# Patient Record
Sex: Female | Born: 1950 | Race: White | Hispanic: No | Marital: Married
Health system: Midwestern US, Academic
[De-identification: ages and names within clinical notes are randomized; demographics above are authoritative.]

---

## 2016-08-15 MED ORDER — DULOXETINE 60 MG PO CPDR
60 mg | ORAL_CAPSULE | Freq: Every day | ORAL | 8 refills | 60.00000 days | Status: DC
Start: 2016-08-15 — End: 2016-08-16

## 2016-08-15 MED ORDER — PROGESTERONE MICRONIZED 100 MG PO CAP
100 mg | ORAL_CAPSULE | Freq: Every day | ORAL | 8 refills | Status: DC
Start: 2016-08-15 — End: 2016-08-16

## 2016-08-16 MED ORDER — PROGESTERONE MICRONIZED 100 MG PO CAP
100 mg | ORAL_CAPSULE | Freq: Every day | ORAL | 1 refills | Status: DC
Start: 2016-08-16 — End: 2017-03-14

## 2016-08-16 MED ORDER — DULOXETINE 60 MG PO CPDR
60 mg | ORAL_CAPSULE | Freq: Every day | ORAL | 8 refills | 60.00000 days | Status: DC
Start: 2016-08-16 — End: 2016-08-16

## 2016-08-16 MED ORDER — DULOXETINE 60 MG PO CPDR
60 mg | ORAL_CAPSULE | Freq: Every day | ORAL | 1 refills | 60.00000 days | Status: DC
Start: 2016-08-16 — End: 2017-05-15

## 2016-08-21 MED ORDER — IOPAMIDOL 41 % IT SOLN
2.5 mL | Freq: Once | EPIDURAL | 0 refills | Status: CP
Start: 2016-08-21 — End: ?

## 2016-08-21 MED ORDER — TRIAMCINOLONE ACETONIDE 40 MG/ML IJ SUSP
80 mg | Freq: Once | EPIDURAL | 0 refills | Status: CP
Start: 2016-08-21 — End: ?

## 2016-08-21 MED ORDER — BUPIVACAINE (PF) 0.5 % (5 MG/ML) IJ SOLN
4 mL | Freq: Once | INTRAMUSCULAR | 0 refills | Status: CP
Start: 2016-08-21 — End: ?

## 2016-09-12 MED ORDER — GABAPENTIN 100 MG PO CAP
ORAL_CAPSULE | Freq: Every evening | 3 refills | Status: DC
Start: 2016-09-12 — End: 2016-10-17

## 2016-09-12 MED ORDER — LISINOPRIL 10 MG PO TAB
10 mg | ORAL_TABLET | Freq: Every day | ORAL | 3 refills | Status: DC
Start: 2016-09-12 — End: 2017-02-12

## 2016-09-12 MED ORDER — ALPRAZOLAM 0.25 MG PO TAB
ORAL_TABLET | Freq: Three times a day (TID) | 0 refills | Status: DC | PRN
Start: 2016-09-12 — End: 2017-02-08

## 2016-10-17 ENCOUNTER — Encounter: Admit: 2016-10-17 | Discharge: 2016-10-17 | Payer: MEDICARE

## 2016-10-17 MED ORDER — GABAPENTIN 300 MG PO CAP
300 mg | ORAL_CAPSULE | Freq: Every evening | ORAL | 3 refills | Status: AC
Start: 2016-10-17 — End: 2016-12-19

## 2016-10-17 NOTE — Telephone Encounter
Patient prescribed   gabapentin (NEURONTIN) 100 mg capsule 90 capsule 3 09/12/2016     Sig: Take 1 at night for 3 days, then 2 nightly for 3 days then 3 nightly      She is requesting a new Rx as she is currently taking 3 of the 100 mg capsules per night. New Rx sent to [facility] for 300 mg capsule.

## 2016-10-31 ENCOUNTER — Encounter: Admit: 2016-10-31 | Discharge: 2016-10-31 | Payer: MEDICARE

## 2016-10-31 ENCOUNTER — Ambulatory Visit: Admit: 2016-10-31 | Discharge: 2016-11-01 | Payer: Commercial Managed Care - PPO

## 2016-10-31 DIAGNOSIS — F419 Anxiety disorder, unspecified: Principal | ICD-10-CM

## 2016-10-31 DIAGNOSIS — K529 Noninfective gastroenteritis and colitis, unspecified: Principal | ICD-10-CM

## 2016-10-31 DIAGNOSIS — Z791 Long term (current) use of non-steroidal anti-inflammatories (NSAID): ICD-10-CM

## 2016-10-31 DIAGNOSIS — M545 Low back pain: ICD-10-CM

## 2016-10-31 DIAGNOSIS — I10 Essential (primary) hypertension: ICD-10-CM

## 2016-10-31 MED ORDER — SODIUM,POTASSIUM,MAG SULFATES 17.5-3.13-1.6 GRAM PO SOLR
354 mL | ORAL | 0 refills | Status: SS
Start: 2016-10-31 — End: 2016-12-27

## 2016-10-31 MED ORDER — SODIUM CHLORIDE 0.9 % IV SOLP
INTRAVENOUS | 0 refills | Status: CN
Start: 2016-10-31 — End: ?

## 2016-10-31 NOTE — Progress Notes
Date of Service: 10/31/2016    History of Present Illness    Kimberly Phelps is a 66 y.o. Caucasian female with PMH of anxiety, back pain and HTN, is referred by Larose Kells, MD for diarrhea.    she was in her usual state of health until about 6-7 months ago. She always has had normal bowel movement. Initially was one loose bowel movement in am. Now, she has multiple bathroom trips a day. Initial bowel movement in am in watery, BSS type 6-7, large volume. Later during theday, she will have more explosive watery diarrhea, smaller volume.     In jan 2018, she had fever, sick to her stomach, N/V and diarrhea, which lasted for 5 days. She felt so sick that she couldn't get out of the house and didn't seek medical care. Diarrhea continued this illness. She gets abd cramp just before having bowel movement, which resolves after bowel movement. No BRBPR, hematochezia, no melena. No FI.   Sometimes, she has to push even when it's the diarrhea. She feels incomplete evacuation (which is new to her).bowel movement doesn't correlate with food, or fasting.   In April, her husband had parenteral Abio therapy for severe arm infection. They have 2 cats and 2 dogs which are healthy. Weigh is stable in the last 6 mo. Appetite is OK. No skin rash. Doesn't take anti-diarrhea. No fiber supplements. Stool is easy to flush.     No travel, no sick contact.  She takes Mobic 15 mg every day for back pain at night for 3 yrs.   FH is significant for father having CRC at age 68, blood cancer (?) and prostate cancer.   Mother had breast cancer.        Past Medical History:   Diagnosis Date   ??? Anxiety    ??? Chronic lower back pain    ??? HTN (hypertension)      Past Surgical History:   Procedure Laterality Date   ??? BARTHOLIN GLAND CYST EXCISION  1978   ??? ELBOW SURGERY  1989    scar tissue removal for tendonitis     Social History   Substance Use Topics   ??? Smoking status: Former Smoker     Packs/day: 1.00     Years: 20.00 Types: Cigarettes     Quit date: 11/03/1995   ??? Smokeless tobacco: Never Used      Comment: none x 12 years   ??? Alcohol use 3.6 oz/week     6 Standard drinks or equivalent per week   used to smoke, quit in 1997. She drinks1-2 glasses at night, red wine. She is a Human resources officer. No children. Lives with husband.       Family History   Problem Relation Age of Onset   ??? Cancer-Breast Mother 30   ??? Hypertension Mother    ??? Other Mother      alcholics    ??? Cancer Father      colon, prostate, and Multiple myeloma   ??? Hypertension Father    ??? Other Father      alcohol   ??? Basal Cell Carcinoma Father    ??? Thyroid Disease Sister    ??? Migraines Sister    ??? Depression Sister    ??? Seizures Paternal Uncle    ??? Migraines Paternal Grandmother    ??? Melanoma Neg Hx      Allergies   Allergen Reactions   ??? Erythromycin NAUSEA AND VOMITING     ???  ALPRAZolam (XANAX) 0.25 mg tablet TAKE 1 TABLET BY MOUTH 3 TIMES A DAY AS NEEDED FOR ANXIETY   ??? duloxetine DR (CYMBALTA) 60 mg capsule Take 1 capsule by mouth daily.   ??? estradiol(+) (VIVELLE-DOT) 0.0375 mg/24 hr patch Apply 1 patch to top of skin as directed twice weekly.   ??? gabapentin (NEURONTIN) 300 mg capsule Take 1 capsule by mouth at bedtime daily.   ??? imiquimod(+) (ALDARA) 5 % topical cream Apply  topically to affected area three times weekly. Thin layer to top of nose 2-3 times per week x 4 months   ??? lidocaine (LIDODERM TP) Apply  topically to affected area.   ??? lisinopril (PRINIVIL; ZESTRIL) 10 mg tablet Take 1 tablet by mouth daily.   ??? meloxicam (MOBIC) 15 mg tablet TAKE 1 TABLET BY MOUTH EVERY DAY   ??? progesterone, micronized(+) (PROMETRIUM) 100 mg capsule Take 1 capsule by mouth daily.     Vitals:    10/31/16 1447   BP: (!) 138/92   Pulse: 89   Resp: 20   Temp: 36.6 ???C (97.8 ???F)   TempSrc: Oral   Weight: 70.3 kg (154 lb 14.4 oz)   Height: 160 cm (63)     Body mass index is 27.44 kg/m???.    Physical Exam: GENERAL:   Alert and oriented x 3, not in acute distress, looking stated age.  HEAD:  Normocephalic, atraumatic.  EYES: PERRLA, EOM intact, no conjunctivitis or jaundice.  ENT:  Oropharynx is clear, no thrush or oral ulcer, no nasal discharge.  NECK: Supple, no thyromegaly or bruit.  SKIN:  No rash, ulcer, erythema, spider nevi or jaundice.  LYMPH NODES:  No lymphadenopathy in cervical, axillary or inguinal.  LUNGS: Symmetric expansion, clear to auscultation bilaterally, no crackles or wheezing.  HEART:  Regular rate and rhythm, no murmur or gallop.  ABDOMEN:  Soft, non-tender, non-distended, no costovertebral angle tenderness, no rebound or guarding, no hepatosplenomegaly, no shifting dullness, bowel sounds are normoactive in all four quadrants.  RECTAL EXAM:  Not performed.  EXTREMITIES: No cyanosis, clubbing or edema.  NEURO:  Moves all 4 extremities without difficulty, gait is appropriate.  PSYCH:   Appropriate mood and behavior.    Lab/Radiology/Other Diagnostic Tests:    Orders Only on 10/05/2016   Component Date Value Ref Range Status   ??? White Blood Cells 10/05/2016 11.6* 3.8 - 10.8 Thousand/uL Final   ??? RBC 10/05/2016 4.88  3.80 - 5.10 Million/uL Final   ??? Hemoglobin 10/05/2016 14.8  11.7 - 15.5 g/dL Final   ??? Hematocrit 10/05/2016 44.1  35.0 - 45.0 % Final   ??? MCV 10/05/2016 90.4  80.0 - 100.0 fL Final   ??? MCH 10/05/2016 30.3  27.0 - 33.0 pg Final   ??? MCHC 10/05/2016 33.6  32.0 - 36.0 g/dL Final   ??? RDW 16/02/9603 12.6  11.0 - 15.0 % Final   ??? Platelet Count 10/05/2016 343  140 - 400 Thousand/uL Final   ??? MPV 10/05/2016 9.8  7.5 - 12.5 fL Final   ??? Absolute Neutrophil Count 10/05/2016 7784  1500 - 7800 cells/uL Final   ??? Absolute Lymph Count 10/05/2016 2888  850 - 3900 cells/uL Final   ??? Absolute Monocyte Count 10/05/2016 708  200 - 950 cells/uL Final   ??? Absolute Eosinophil Count 10/05/2016 139  15 - 500 cells/uL Final   ??? Absolute Basophil Count 10/05/2016 81  0 - 200 cells/uL Final ??? Neutrophils 10/05/2016 67.1  % Final   ??? Lymphocytes  10/05/2016 24.9  % Final   ??? Monocytes 10/05/2016 6.1  % Final   ??? Eosinophils 10/05/2016 1.2  % Final   ??? Basophils 10/05/2016 0.7  % Final    Comment: Test Performed at:  [facility]  [address]  [place]  [id number]  [place]         No results found for: [facility]    Comprehensive Metabolic Profile    Lab Results   Component Value Date/Time    NA 141 09/12/2016 03:24 PM    K 4.3 09/12/2016 03:24 PM    CL 104 09/12/2016 03:24 PM    CO2 24 09/12/2016 03:24 PM    GAP 7 (L) 10/04/2007 11:20 AM    BUN 19 09/12/2016 03:24 PM    CR 0.90 09/12/2016 03:24 PM    GLU 72 09/12/2016 03:24 PM    Lab Results   Component Value Date/Time    CA 9.6 09/12/2016 03:24 PM    ALBUMIN 4.4 03/26/2016 09:09 AM    TOTPROT 6.3 03/26/2016 09:09 AM    ALKPHOS 61 03/26/2016 09:09 AM    AST 17 03/26/2016 09:09 AM    ALT 12 03/26/2016 09:09 AM    TOTBILI 0.5 03/26/2016 09:09 AM    GFR 68 09/12/2016 03:24 PM    GFRAA 78 09/12/2016 03:24 PM             Review of Systems   Gastrointestinal: Positive for diarrhea and nausea.   Genitourinary: Positive for enuresis.   Musculoskeletal: Positive for back pain and myalgias.   Psychiatric/Behavioral: The patient is nervous/anxious.    All other systems reviewed and are negative.        Physical Exam         Assessment and Plan:      1. New onset chronic diarrhea  2. NSAID use         Kimberly Phelps is 66 y.o. WF with new onset chronic diarrhea, which started after an episode of febrile severe N/V/D. She complains of watery large volume diarrhea, and sense of incomplete evacuation. She drinks 2 ETOH drinks a day.  DD are post infectious IBS, vs. IBD, microscopic colitis, SIBO, pancreatic insufficiency, pelvic floor dysynergia, ...    Will check the severity and type of diarrhea. Infectious causes were tested. Stool leukocytes were negative.  Will check 24 hrs stool collection for fat and volume, stool elastase and stool electrolytes.  colonoscopy and EGD with Bx's.  CTE.  SIBO  Return to clinic after these tests.     Orders Placed This Encounter   ??? CT ENTEROGRAPHY   ??? FECAL FAT (TOTAL LIPIDS)   ??? ELECTROLYTES-FECAL   ??? PANCREATIC ELASTASE, STOOL            I explained the diagnosis and management plan in detail. No barrier to education was noted. she understood me well and repeated her understanding. I answered all her questions and concerns.   Jolee Ewing, MD

## 2016-11-01 ENCOUNTER — Encounter: Admit: 2016-11-01 | Discharge: 2016-11-01 | Payer: MEDICARE

## 2016-11-01 MED ORDER — ESTRADIOL 0.0375 MG/24 HR TD PTSW
MEDICATED_PATCH | 2 refills | 43.00000 days | Status: AC
Start: 2016-11-01 — End: 2017-12-18

## 2016-11-09 ENCOUNTER — Ambulatory Visit: Admit: 2016-11-09 | Discharge: 2016-11-09 | Payer: Commercial Managed Care - PPO

## 2016-11-09 DIAGNOSIS — Z791 Long term (current) use of non-steroidal anti-inflammatories (NSAID): Secondary | ICD-10-CM

## 2016-11-09 DIAGNOSIS — K529 Noninfective gastroenteritis and colitis, unspecified: Principal | ICD-10-CM

## 2016-11-09 LAB — POC CREATININE, RAD: Lab: 0.8 mg/dL (ref 0.4–1.00)

## 2016-11-09 MED ORDER — IOHEXOL 350 MG IODINE/ML IV SOLN
100 mL | Freq: Once | INTRAVENOUS | 0 refills | Status: CP
Start: 2016-11-09 — End: ?
  Administered 2016-11-09: 20:00:00 100 mL via INTRAVENOUS

## 2016-11-09 MED ORDER — SODIUM CHLORIDE 0.9 % IJ SOLN
50 mL | Freq: Once | INTRAVENOUS | 0 refills | Status: CP
Start: 2016-11-09 — End: ?
  Administered 2016-11-09: 20:00:00 50 mL via INTRAVENOUS

## 2016-11-26 ENCOUNTER — Encounter: Admit: 2016-11-26 | Discharge: 2016-11-26 | Payer: MEDICARE

## 2016-12-10 ENCOUNTER — Encounter: Admit: 2016-12-10 | Discharge: 2016-12-10 | Payer: MEDICARE

## 2016-12-10 NOTE — Telephone Encounter
Pre call completed to patient for their Hydrogen Breath Test scheduled on 12/12/16 at 0730. Diet instructions and restrictions reviewed at this time. Patient states understanding.

## 2016-12-12 ENCOUNTER — Encounter: Admit: 2016-12-12 | Discharge: 2016-12-12 | Payer: MEDICARE

## 2016-12-12 ENCOUNTER — Ambulatory Visit: Admit: 2016-12-12 | Discharge: 2016-12-12 | Payer: Commercial Managed Care - PPO

## 2016-12-12 DIAGNOSIS — I10 Essential (primary) hypertension: ICD-10-CM

## 2016-12-12 DIAGNOSIS — F419 Anxiety disorder, unspecified: Principal | ICD-10-CM

## 2016-12-12 DIAGNOSIS — Z791 Long term (current) use of non-steroidal anti-inflammatories (NSAID): Secondary | ICD-10-CM

## 2016-12-12 DIAGNOSIS — K529 Noninfective gastroenteritis and colitis, unspecified: Principal | ICD-10-CM

## 2016-12-12 DIAGNOSIS — M545 Low back pain: ICD-10-CM

## 2016-12-19 ENCOUNTER — Encounter: Admit: 2016-12-19 | Discharge: 2016-12-19 | Payer: MEDICARE

## 2016-12-19 ENCOUNTER — Ambulatory Visit: Admit: 2016-12-19 | Discharge: 2016-12-20 | Payer: Commercial Managed Care - PPO

## 2016-12-19 DIAGNOSIS — M47816 Spondylosis without myelopathy or radiculopathy, lumbar region: ICD-10-CM

## 2016-12-19 DIAGNOSIS — I10 Essential (primary) hypertension: ICD-10-CM

## 2016-12-19 DIAGNOSIS — M1710 Unilateral primary osteoarthritis, unspecified knee: ICD-10-CM

## 2016-12-19 DIAGNOSIS — F419 Anxiety disorder, unspecified: Principal | ICD-10-CM

## 2016-12-19 DIAGNOSIS — M545 Low back pain: ICD-10-CM

## 2016-12-19 MED ORDER — DICLOFENAC SODIUM 1 % TP GEL
4 g | Freq: Three times a day (TID) | TOPICAL | 3 refills | 19.00000 days | Status: AC
Start: 2016-12-19 — End: 2017-09-04

## 2016-12-20 ENCOUNTER — Encounter: Admit: 2016-12-20 | Discharge: 2016-12-20 | Payer: MEDICARE

## 2016-12-20 DIAGNOSIS — M5416 Radiculopathy, lumbar region: Principal | ICD-10-CM

## 2016-12-20 MED ORDER — MELOXICAM 15 MG PO TAB
ORAL_TABLET | Freq: Every day | 1 refills | Status: SS
Start: 2016-12-20 — End: 2016-12-27

## 2016-12-20 NOTE — Telephone Encounter
Contacted pt and informed her of Negative for SIBO on Hydrogen Breath test preformed on 12/12/16.    Pt had no further questions or concerns. Pt has colonoscopy scheduled 12/27/16.

## 2016-12-20 NOTE — Telephone Encounter
Patient seen in clinic by Dr. Johney MaineMitra-requesting assist with locating an aquatic therapy location, as insurance not covering location suggested in clinic. Emailed list of locations to patient. WIll call with any further needs.

## 2016-12-27 ENCOUNTER — Encounter: Admit: 2016-12-27 | Discharge: 2016-12-27 | Payer: MEDICARE

## 2016-12-27 ENCOUNTER — Ambulatory Visit: Admit: 2016-12-27 | Discharge: 2016-12-27 | Payer: Commercial Managed Care - PPO

## 2016-12-27 ENCOUNTER — Ambulatory Visit: Admit: 2016-12-27 | Discharge: 2016-12-27 | Payer: MEDICARE

## 2016-12-27 DIAGNOSIS — K5730 Diverticulosis of large intestine without perforation or abscess without bleeding: ICD-10-CM

## 2016-12-27 DIAGNOSIS — K259 Gastric ulcer, unspecified as acute or chronic, without hemorrhage or perforation: ICD-10-CM

## 2016-12-27 DIAGNOSIS — K210 Gastro-esophageal reflux disease with esophagitis: ICD-10-CM

## 2016-12-27 DIAGNOSIS — F419 Anxiety disorder, unspecified: Principal | ICD-10-CM

## 2016-12-27 DIAGNOSIS — Z87891 Personal history of nicotine dependence: ICD-10-CM

## 2016-12-27 DIAGNOSIS — K644 Residual hemorrhoidal skin tags: ICD-10-CM

## 2016-12-27 DIAGNOSIS — K449 Diaphragmatic hernia without obstruction or gangrene: ICD-10-CM

## 2016-12-27 DIAGNOSIS — M545 Low back pain: ICD-10-CM

## 2016-12-27 DIAGNOSIS — Z791 Long term (current) use of non-steroidal anti-inflammatories (NSAID): ICD-10-CM

## 2016-12-27 DIAGNOSIS — K319 Disease of stomach and duodenum, unspecified: ICD-10-CM

## 2016-12-27 DIAGNOSIS — R197 Diarrhea, unspecified: Principal | ICD-10-CM

## 2016-12-27 DIAGNOSIS — K648 Other hemorrhoids: ICD-10-CM

## 2016-12-27 DIAGNOSIS — I10 Essential (primary) hypertension: ICD-10-CM

## 2016-12-27 DIAGNOSIS — Z800 Family history of malignant neoplasm of digestive organs: ICD-10-CM

## 2016-12-27 DIAGNOSIS — M1710 Unilateral primary osteoarthritis, unspecified knee: ICD-10-CM

## 2016-12-27 DIAGNOSIS — M47816 Spondylosis without myelopathy or radiculopathy, lumbar region: ICD-10-CM

## 2016-12-27 MED ORDER — LACTATED RINGERS IV SOLP
500 mL | INTRAVENOUS | 0 refills | Status: DC
Start: 2016-12-27 — End: 2016-12-27

## 2016-12-27 MED ORDER — LIDOCAINE (PF) 200 MG/10 ML (2 %) IJ SYRG
0 refills | Status: DC
Start: 2016-12-27 — End: 2016-12-27
  Administered 2016-12-27: 18:00:00 40 mg via INTRAVENOUS

## 2016-12-27 MED ORDER — PROPOFOL INJ 10 MG/ML IV VIAL
0 refills | Status: DC
Start: 2016-12-27 — End: 2016-12-27
  Administered 2016-12-27: 18:00:00 20 mg via INTRAVENOUS
  Administered 2016-12-27: 18:00:00 80 mg via INTRAVENOUS
  Administered 2016-12-27: 18:00:00 20 mg via INTRAVENOUS
  Administered 2016-12-27: 18:00:00 50 mg via INTRAVENOUS
  Administered 2016-12-27: 18:00:00 30 mg via INTRAVENOUS
  Administered 2016-12-27: 18:00:00 60 mg via INTRAVENOUS
  Administered 2016-12-27: 18:00:00 20 mg via INTRAVENOUS

## 2016-12-27 MED ORDER — ONDANSETRON HCL (PF) 4 MG/2 ML IJ SOLN
4 mg | Freq: Once | INTRAVENOUS | 0 refills | Status: DC | PRN
Start: 2016-12-27 — End: 2016-12-27

## 2016-12-27 MED ORDER — OMEPRAZOLE 20 MG PO CPDR
20 mg | ORAL_CAPSULE | Freq: Every day | ORAL | 3 refills | Status: AC
Start: 2016-12-27 — End: 2017-04-02

## 2016-12-27 MED ORDER — PROPOFOL 10 MG/ML IV EMUL 20 ML (INFUSION)(AM)(OR)
INTRAVENOUS | 0 refills | Status: DC
Start: 2016-12-27 — End: 2016-12-27
  Administered 2016-12-27: 18:00:00 100 ug/kg/min via INTRAVENOUS

## 2016-12-27 MED ORDER — LIDOCAINE (PF) 10 MG/ML (1 %) IJ SOLN
.1-2 mL | Freq: Once | INTRAMUSCULAR | 0 refills | Status: CP
Start: 2016-12-27 — End: ?

## 2016-12-27 MED ADMIN — LIDOCAINE (PF) 10 MG/ML (1 %) IJ SOLN [95838]: 0.1 mL | INTRAMUSCULAR | @ 17:00:00 | Stop: 2016-12-27 | NDC 63323049227

## 2016-12-27 MED ADMIN — LACTATED RINGERS IV SOLP [4318]: 500 mL | INTRAVENOUS | @ 17:00:00 | Stop: 2016-12-27 | NDC 00409795303

## 2016-12-27 NOTE — H&P (View-Only)
Pre Procedure History and Physical/Sedation Plan    Date of Procedure:  12/27/2016    Planned Procedure(s):  EGD and colonoscopy   Sedation/Medication Plan: MAC (Monitored Anesthesia Care)  Discussion/Reviews:  Physician has discussed risks and alternatives of this type of sedation and above planned procedures with patient.  ___________________________________________________________________  Chief Complaint:  Diarrhea, father had CRC at age 65's. Her last colonoscopy was in 2013    History of Present Illness: Kimberly Phelps is a 66 y.o. female with   Past Medical History:   Diagnosis Date   ??? Anxiety    ??? Chronic lower back pain    ??? HTN (hypertension)    ??? Lumbar facet arthropathy (HCC)    ??? Patellofemoral arthritis        Past Surgical History:   Procedure Laterality Date   ??? BARTHOLIN GLAND CYST EXCISION  1978   ??? ELBOW SURGERY Right 1989    scar tissue removal for tendonitis       Allergies   Allergen Reactions   ??? Erythromycin NAUSEA AND VOMITING       Family History   Problem Relation Age of Onset   ??? Cancer-Breast Mother 59   ??? Hypertension Mother    ??? Other Mother         alcholics    ??? Cancer Father         colon, prostate, and Multiple myeloma   ??? Hypertension Father    ??? Other Father         alcohol   ??? Basal Cell Carcinoma Father    ??? Thyroid Disease Sister    ??? Migraines Sister    ??? Depression Sister    ??? Seizures Paternal Uncle    ??? Migraines Paternal Grandmother    ??? Melanoma Neg Hx        Previous Anesthetic/Sedation History:  No complications    Nursing Medical History     Nursing Surgical History     Social History   Substance Use Topics   ??? Smoking status: Former Smoker     Packs/day: 1.00     Years: 20.00     Types: Cigarettes     Quit date: 11/03/1995   ??? Smokeless tobacco: Never Used      Comment: none x 12 years   ??? Alcohol use 8.4 oz/week     6 Standard drinks or equivalent, 8 Glasses of wine per week     History   Drug Use No     Allergies:  Erythromycin  Medications Prescriptions Prior to Admission   Medication Sig   ??? ALPRAZolam (XANAX) 0.25 mg tablet TAKE 1 TABLET BY MOUTH 3 TIMES A DAY AS NEEDED FOR ANXIETY   ??? diclofenac(+) (VOLTAREN) 1 % topical gel Apply 4 g topically to affected area three times daily.   ??? duloxetine DR (CYMBALTA) 60 mg capsule Take 1 capsule by mouth daily.   ??? estradiol(+) (VIVELLE-DOT; ESCLIM) 0.0375 mg/24 hr patch PLACE 1 PATCH EVERY 7 DAYS.   ??? imiquimod(+) (ALDARA) 5 % topical cream Apply  topically to affected area three times weekly. Thin layer to top of nose 2-3 times per week x 4 months   ??? lidocaine (LIDODERM TP) Apply  topically to affected area.   ??? lisinopril (PRINIVIL; ZESTRIL) 10 mg tablet Take 1 tablet by mouth daily.   ??? progesterone, micronized(+) (PROMETRIUM) 100 mg capsule Take 1 capsule by mouth daily.     Review of Systems:  Comprehensive ROS  was reviewed with patient and is positive for as above.            Physical Exam:   Temp: 36.8 ???C (98.2 ???F) (08/16 1200)  Pulse: 74 (08/16 1200)  Respirations: 12 PER MINUTE (08/16 1200)  BP: 127/88 (08/16 1200)  GENERAL:   Alert and oriented x 3, not in acute distress, looking stated age, cooperative.  HEAD:  Normocephalic, atraumatic.  ENT:  Oropharynx is clear, no thrush or oral ulcer, no nasal discharge.  LUNGS: Symmetric expansion, clear to auscultation bilaterally, no crackles or wheezing.  HEART:  Regular rate and rhythm, no murmur or gallop.  ABDOMEN: Soft, non-tender, non-distended, no costovertebral angle tenderness, no rebound or guarding, no hepatosplenomegaly, no shifting dullness, bowel sounds are normoactive in all four quadrants.  EXTREMITIES:   No cyanosis, clubbing or edema.    Airway:  airway assessment performed  Mallampati I (soft palate, uvula, fauces, tonsillar pillars visible)  Anesthesia Classification:  ASA II (A normal patient with mild systemic disease)    Lab/Radiology/Other Diagnostic Tests  Labs:    Hematology:    Lab Results   Component Value Date HGB 14.8 10/05/2016    HCT 44.1 10/05/2016    PLTCT 343 10/05/2016    WBC 11.6 10/05/2016    NEUT 67.1 10/05/2016    ANC 7784 10/05/2016    ALC 2888 10/05/2016    RBC 4.88 10/05/2016    MONA 6.1 10/05/2016    AMC 708 10/05/2016    EOSA 1.2 10/05/2016    ABC 81 10/05/2016    MCV 90.4 10/05/2016    MCH 30.3 10/05/2016    MCHC 33.6 10/05/2016    MPV 9.8 10/05/2016    RDW 12.6 10/05/2016    and Coagulation:  No results found for: PT, PTT, INR    ATTESTATION  I personally performed the key portions of the E/M visit, discussed case with fellow and concur with fellow documentation of history, physical exam, assessment, and treatment plan unless otherwise noted.        Jolee Ewing, MD

## 2016-12-27 NOTE — Anesthesia Post-Procedure Evaluation
Post-Anesthesia Evaluation    Name: Kimberly Phelps      MRN: [id number]     DOB: April 18, 1951     Age: 66 y.o.     Sex: female   __________________________________________________________________________     Procedure Date: 12/27/2016  Procedure: Procedure(s):  COLONOSCOPY  ESOPHAGOGASTRODUODENOSCOPY w/ bxs      Surgeon: Moishe Spice):  Jolee Ewing, MD    Post-Anesthesia Vitals  BP: 148/86 (08/16 1354)  Temp: 36.7 C (98 F) (08/16 1354)  Pulse: 66 (08/16 1354)  Respirations: 15 PER MINUTE (08/16 1354)  SpO2: 99 % (08/16 1354)  O2 Delivery: None (Room Air) (08/16 1354)  SpO2 Pulse: 66 (08/16 1354)  Height: 160 cm (63") (08/16 1142)      Post Anesthesia Evaluation Note    Evaluation location: Pre/Post  Patient participation: recovered; patient participated in evaluation  Level of consciousness: alert    Pain score: 0  Pain management: adequate    Hydration: normovolemia  Temperature: 36.0C - 38.4C  Airway patency: adequate    Perioperative Events  Perioperative events:  no       Post-op nausea and vomiting: no PONV    Postoperative Status  Cardiovascular status: hemodynamically stable  Respiratory status: spontaneous ventilation  Follow-up needed: none        Perioperative Events  Perioperative Event: No  Emergency Case Activation: No

## 2016-12-31 ENCOUNTER — Encounter: Admit: 2016-12-31 | Discharge: 2016-12-31 | Payer: MEDICARE

## 2016-12-31 DIAGNOSIS — I10 Essential (primary) hypertension: ICD-10-CM

## 2016-12-31 DIAGNOSIS — M545 Low back pain: ICD-10-CM

## 2016-12-31 DIAGNOSIS — M1710 Unilateral primary osteoarthritis, unspecified knee: ICD-10-CM

## 2016-12-31 DIAGNOSIS — Z1239 Encounter for other screening for malignant neoplasm of breast: Principal | ICD-10-CM

## 2016-12-31 DIAGNOSIS — F419 Anxiety disorder, unspecified: Principal | ICD-10-CM

## 2016-12-31 DIAGNOSIS — M47816 Spondylosis without myelopathy or radiculopathy, lumbar region: ICD-10-CM

## 2017-01-24 ENCOUNTER — Encounter: Admit: 2017-01-24 | Discharge: 2017-01-24 | Payer: MEDICARE

## 2017-01-24 NOTE — Telephone Encounter
Received voicemail from Physical Therapist, Dagoberto Reef, regarding plan of care for patient needing signed, 3 attempt. Left message with fax number to fax of [phone].

## 2017-01-24 NOTE — Telephone Encounter
Received voicemail from patient regarding plan of care needing signed by Dr. Salley Scarlet. Called patient and left message that this nurse had been contacted by the physical therapist earlier. Informed patient the fax number was provided to the physical therapist and Dr. Salley Scarlet will be in Tuesday to sign.

## 2017-02-07 ENCOUNTER — Encounter: Admit: 2017-02-07 | Discharge: 2017-02-07 | Payer: MEDICARE

## 2017-02-07 NOTE — Telephone Encounter
lov 09-12-2016    Lf 09-12-2016

## 2017-02-08 MED ORDER — ALPRAZOLAM 0.25 MG PO TAB
ORAL_TABLET | Freq: Three times a day (TID) | 3 refills | Status: AC | PRN
Start: 2017-02-08 — End: 2018-08-27

## 2017-02-12 ENCOUNTER — Ambulatory Visit: Admit: 2017-02-12 | Discharge: 2017-02-13 | Payer: Commercial Managed Care - PPO

## 2017-02-12 ENCOUNTER — Encounter: Admit: 2017-02-12 | Discharge: 2017-02-12 | Payer: MEDICARE

## 2017-02-12 ENCOUNTER — Ambulatory Visit: Admit: 2017-02-12 | Discharge: 2017-02-12 | Payer: Commercial Managed Care - PPO

## 2017-02-12 DIAGNOSIS — Z23 Encounter for immunization: ICD-10-CM

## 2017-02-12 DIAGNOSIS — F419 Anxiety disorder, unspecified: ICD-10-CM

## 2017-02-12 DIAGNOSIS — M545 Low back pain: ICD-10-CM

## 2017-02-12 DIAGNOSIS — M47816 Spondylosis without myelopathy or radiculopathy, lumbar region: ICD-10-CM

## 2017-02-12 DIAGNOSIS — Z0000 Encounter for general adult medical examination without abnormal findings: Principal | ICD-10-CM

## 2017-02-12 DIAGNOSIS — K529 Noninfective gastroenteritis and colitis, unspecified: ICD-10-CM

## 2017-02-12 DIAGNOSIS — M1710 Unilateral primary osteoarthritis, unspecified knee: ICD-10-CM

## 2017-02-12 DIAGNOSIS — I10 Essential (primary) hypertension: Secondary | ICD-10-CM

## 2017-02-12 DIAGNOSIS — H9193 Unspecified hearing loss, bilateral: ICD-10-CM

## 2017-02-12 DIAGNOSIS — Z79890 Hormone replacement therapy (postmenopausal): ICD-10-CM

## 2017-02-12 LAB — LIPID PROFILE
Lab: 133 mg/dL — ABNORMAL HIGH (ref <100)
Lab: 139 mg/dL (ref 6.0–8.0)
Lab: 18 mg/dL (ref 8.5–10.6)
Lab: 212 mg/dL — ABNORMAL HIGH (ref <200)
Lab: 89 mg/dL (ref <150)

## 2017-02-12 LAB — COMPREHENSIVE METABOLIC PANEL
Lab: 0.4 mg/dL (ref 0.3–1.2)
Lab: 138 MMOL/L (ref 137–147)
Lab: 18 U/L (ref 7–40)
Lab: 27 MMOL/L (ref 21–30)
Lab: 3.8 MMOL/L (ref 3.5–5.1)
Lab: 4.2 g/dL (ref 3.5–5.0)
Lab: 58 U/L (ref 25–110)
Lab: 6 (ref 3–12)
Lab: >60 mL/min (ref >60)

## 2017-02-12 LAB — TSH WITH FREE T4 REFLEX: Lab: 3 uU/mL (ref 0.35–5.00)

## 2017-02-12 LAB — CBC
Lab: 10 10*3/uL (ref 4.5–11.0)
Lab: 4.7 M/UL (ref 4.0–5.0)

## 2017-02-12 MED ORDER — LISINOPRIL 20 MG PO TAB
20 mg | ORAL_TABLET | Freq: Every day | ORAL | 3 refills | Status: AC
Start: 2017-02-12 — End: 2017-12-16

## 2017-02-12 NOTE — Progress Notes
History of Present Illness  Kimberly Phelps is a 66 y.o. female.    Anxiety  0.25mg  alprazolam tid as needed  Occasional [place] only.   Depression screen neg.    HRT:  estradiol(+) (VIVELLE-DOT) 0.0375 mg/24 hr patch; Apply 1 patch to top of skin as directed twice weekly.  progesterone, micronized(+) (PROMETRIUM) 100 mg capsule  > start date: > 5 yearsm she is on 1/2 patch  > no history of hysterctomy    Hypertension Management:    Current regimen: Lisinopril 10mg   CVD Hx: none  Smoker: No  Outside blood pressures being performed: Yes  BP Readings from Last 3 Encounters:   02/12/17 137/87   12/27/16 148/86   12/19/16 139/89     The patient denies chest pain, shortness of breath, exertional shortness of breath, leg swelling.   Imp: Hypertension not controlled   Lab Results   Component Value Date/Time    MCALBR <0.2 01/17/2012 10:23 AM       Esophagitis and GERD:  Omeprazole 20mg  PO daily  EGD 12/27/2016 2 cm hiatal hernia, non bleeding erosions in the antrum, esophagitis. Neg H pylori.    Chronic Diarrhea - not active.  Jolee Ewing, MD  Panscreatic elastase, fecal electrolytes and fecal fat are pending.   Every since the colonoscopy and prep she has had no recurrence of the diarrhea. Total resolution.= in her symptoms.     Lumbar Radiculitis, Lumbar Facet Arthropathy   Salley Scarlet, MD, Voltaren Gel  > MRI planned if 6 weeks of NSAIDs with PT does not improve pain.  > She has started aqua therapy and some land therapy. Doing well.     Health Maintenance  Breast Cancer Screening (q46yr): 11/11/2015 birads 1  Cervical Cancer Screening: (q5y w/ HPV, q3r):  2016  Colorectal Cancer:12/27/2016 q5 years  Bone Desnit: repeat due in 2019  BMI: Estimated body mass index is 27.28 kg/m??? as calculated from the following:    Height as of this encounter: 160 cm (63).    Weight as of this encounter: 69.9 kg (154 lb).  Tobacco Screen (q2y):   History   Smoking Status   ??? Former Smoker   ??? Packs/day: 1.00   ??? Years: 20.00 ??? Types: Cigarettes   ??? Quit date: 11/03/1995   Smokeless Tobacco   ??? Never Used     Comment: none x 12 years      Fall Risk Assessment: low risk  Post Menopaussal?  yes    Depression Screening:  Patient Scores:  PHQ-2: PHQ-2 Score: 0 (03/16/2016  2:31 PM)  PHQ-9: No Data Recorded  Interventions:  PHQ-2: PHQ-2 Score less than 3: No follow-up or recommendations are necessary at this time (03/16/2016  2:31 PM)  Depression Interventions PHQ-2/9: No Data Recorded    Social  She used to live in [place]. She has 2 sisters in the area.  Has step kids.   She worked in Education officer, environmental.  Works at [employer], [place] [employer] [place].       Review of Systems   Constitutional: Negative for activity change, chills and unexpected weight change.   HENT: Negative for congestion and sore throat.    Eyes: Negative for visual disturbance.   Respiratory: Negative for cough, chest tightness, shortness of breath and wheezing.    Cardiovascular: Negative for chest pain and leg swelling.   Gastrointestinal: Negative for abdominal pain and blood in stool.   Endocrine: Negative for cold intolerance, heat intolerance, polydipsia, polyphagia and polyuria.   Genitourinary:  Negative for difficulty urinating and urgency.   Musculoskeletal: Negative for arthralgias and myalgias.   Skin: Negative for color change and rash.   Neurological: Negative for dizziness and light-headedness.   Hematological: Negative for adenopathy.   Psychiatric/Behavioral: The patient is not nervous/anxious.          Objective:         ??? ALPRAZolam (XANAX) 0.25 mg tablet TAKE 1 TABLET BY MOUTH 3 TIMES A DAY AS NEEDED FOR ANXIETY   ??? diclofenac(+) (VOLTAREN) 1 % topical gel Apply 4 g topically to affected area three times daily.   ??? duloxetine DR (CYMBALTA) 60 mg capsule Take 1 capsule by mouth daily.   ??? estradiol(+) (VIVELLE-DOT; ESCLIM) 0.0375 mg/24 hr patch PLACE 1 PATCH EVERY 7 DAYS.   ??? imiquimod(+) (ALDARA) 5 % topical cream Apply  topically to affected area three times weekly. Thin layer to top of nose 2-3 times per week x 4 months   ??? lidocaine (LIDODERM TP) Apply  topically to affected area.   ??? lisinopril (PRINIVIL; ZESTRIL) 10 mg tablet Take 1 tablet by mouth daily.   ??? omeprazole DR(+) (PRILOSEC) 20 mg capsule Take one capsule by mouth daily before breakfast.   ??? progesterone, micronized(+) (PROMETRIUM) 100 mg capsule Take 1 capsule by mouth daily.     Vitals:    02/12/17 1521   BP: 137/87   Pulse: 92   Resp: 16   Weight: 69.9 kg (154 lb)   Height: 160 cm (63)     Vitals:    02/12/17 1521   BP: 137/87   Pulse: 92   Resp: 16   Weight: 69.9 kg (154 lb)   Height: 160 cm (63)       Body mass index is 27.28 kg/m???.     Physical Exam   Constitutional: She appears well-developed.   HENT:   Head: Normocephalic and atraumatic.   Right Ear: External ear normal.   Left Ear: External ear normal.   Mouth/Throat: Oropharynx is clear and moist.   Eyes: Conjunctivae and EOM are normal.   Neck: Normal range of motion. No thyromegaly present.   Cardiovascular: Normal rate, regular rhythm, normal heart sounds and intact distal pulses.    Pulmonary/Chest: Effort normal and breath sounds normal.   Abdominal: Soft. Bowel sounds are normal.   Musculoskeletal: Normal range of motion.   Lymphadenopathy:     She has no cervical adenopathy.   Neurological: She is alert.   Skin: Skin is warm and dry.   Psychiatric: She has a normal mood and affect.   Nursing note and vitals reviewed.      Labwork reviewed:  Lab Results   Component Value Date/Time    MCALBR <0.2 01/17/2012 10:23 AM    TSH3G 0.55 03/26/2016 09:09 AM    FREET4R 1.1 11/03/2010 02:15 PM    CHOL 192 03/26/2016 09:09 AM    TRIG 70 03/26/2016 09:09 AM    HDL 69 03/26/2016 09:09 AM    LDL 107 (H) 03/26/2016 09:09 AM    NA 141 09/12/2016 03:24 PM    K 4.3 09/12/2016 03:24 PM    CL 104 09/12/2016 03:24 PM    CO2 24 09/12/2016 03:24 PM    GAP 7 (L) 10/04/2007 11:20 AM    BUN 19 09/12/2016 03:24 PM    CR 0.8 11/09/2016 02:23 PM CR 0.90 09/12/2016 03:24 PM    GLU 72 09/12/2016 03:24 PM    CA 9.6 09/12/2016 03:24 PM  ALBUMIN 4.4 03/26/2016 09:09 AM    TOTPROT 6.3 03/26/2016 09:09 AM    ALKPHOS 61 03/26/2016 09:09 AM    AST 17 03/26/2016 09:09 AM    ALT 12 03/26/2016 09:09 AM    TOTBILI 0.5 03/26/2016 09:09 AM    GFR 68 09/12/2016 03:24 PM    GFRAA 78 09/12/2016 03:24 PM            Assessment and Plan:  Kimberly Phelps was seen today for establish care.    Diagnoses and all orders for this visit:    Healthcare maintenance: New patient visit.  All questions were answered today. Lovely 66 yo F patient.  -     CBC; Future; Expected date: 02/12/2017  -     COMPREHENSIVE METABOLIC PANEL; Future; Expected date: 02/12/2017  -     TSH WITH FREE T4 REFLEX; Future; Expected date: 02/12/2018  -     LIPID PROFILE; Future; Expected date: 02/12/2017  -     25-OH VITAMIN D (D2 + D3); Future; Expected date: 02/12/2017  Need for influenza vaccination  -     FLU VACCINE =>6 MONTHS QUADRIVALENT PF    HTN (hypertension), benign:  Not at goal.  > increase lisinopril to 20mg  daily  > surveillance CMP  -     COMPREHENSIVE METABOLIC PANEL; Future; Expected date: 02/12/2017  -     lisinopril (PRINIVIL; ZESTRIL) 20 mg tablet; Take one tablet by mouth daily.    Anxiety: Chronic and well controlled.   -     TSH WITH FREE T4 REFLEX; Future; Expected date: 02/12/2018    Lumbar facet arthropathy: Chronic and doing well w/ aqua therapy.    Chronic diarrhea: resolved    Gastritis: Chronic and mild.  > 3 mo ppi trial and then taper    Bilateral hearing loss, unspecified hearing loss type  -     AMB REFERRAL TO AUDIOLOGY    Hormone Replacement Therapy: Chronic and > 5 years duration.  She has been self tapering slowly.   > continue self taper of Estrogen/progesterone replacement therapy                               Patient Instructions   General Instructions:  ??? How to reach me:?????? Please send a MyChart message to the General Medicine clinic or call Emilia Beck at [phone].  ??? How to get a medication refill:??? Please use the MyChart Refill request or contact your pharmacy directly to request medication refills. Please allow 48 hours.??? ???  ??? How to receive your test results:??? If you have signed up for MyChart, you will receive your test results and messages from me this way.??? Otherwise, you will get a phone call or letter.?????? If you are expecting results and have not heard from my office within 2 weeks of your testing, please send a MyChart message or call my office.??? ???  ??? Scheduling:??? Our Scheduling phone number is [phone].??? I am available for clinic appointments Wednesday - Friday. If you have an urgent issue, same day appointments are available with other physicians. Please ask for an annual or yearly physical appointment if it has been over 1 year since our last appointment.  ??? Appointment Reminders on your cell phone: Make sure we have your cell phone number, and Text [place] to [phone].  ??? Support groups for many chronic illnesses are available through [facility]: [email or web address] or [phone].  Return in about 6 months (around 08/13/2017).

## 2017-02-13 LAB — 25-OH VITAMIN D (D2 + D3): Lab: 13 ng/mL — ABNORMAL LOW (ref >40)

## 2017-02-16 ENCOUNTER — Ambulatory Visit: Admit: 2017-02-16 | Discharge: 2017-02-16 | Payer: Commercial Managed Care - PPO

## 2017-02-16 DIAGNOSIS — Z1239 Encounter for other screening for malignant neoplasm of breast: ICD-10-CM

## 2017-02-16 DIAGNOSIS — Z1231 Encounter for screening mammogram for malignant neoplasm of breast: Principal | ICD-10-CM

## 2017-03-13 ENCOUNTER — Encounter: Admit: 2017-03-13 | Discharge: 2017-03-13 | Payer: MEDICARE

## 2017-03-13 ENCOUNTER — Ambulatory Visit: Admit: 2017-03-13 | Discharge: 2017-03-13 | Payer: Commercial Managed Care - PPO

## 2017-03-13 ENCOUNTER — Ambulatory Visit: Admit: 2017-03-13 | Discharge: 2017-03-13 | Payer: MEDICARE

## 2017-03-13 DIAGNOSIS — M170 Bilateral primary osteoarthritis of knee: Principal | ICD-10-CM

## 2017-03-13 DIAGNOSIS — M47816 Spondylosis without myelopathy or radiculopathy, lumbar region: ICD-10-CM

## 2017-03-13 DIAGNOSIS — F419 Anxiety disorder, unspecified: Principal | ICD-10-CM

## 2017-03-13 DIAGNOSIS — M545 Low back pain: ICD-10-CM

## 2017-03-13 DIAGNOSIS — M1710 Unilateral primary osteoarthritis, unspecified knee: ICD-10-CM

## 2017-03-13 DIAGNOSIS — I10 Essential (primary) hypertension: ICD-10-CM

## 2017-03-14 ENCOUNTER — Encounter: Admit: 2017-03-14 | Discharge: 2017-03-14 | Payer: MEDICARE

## 2017-03-14 MED ORDER — PROGESTERONE MICRONIZED 100 MG PO CAP
100 mg | ORAL_CAPSULE | Freq: Every day | ORAL | 1 refills | Status: AC
Start: 2017-03-14 — End: 2017-12-18

## 2017-03-17 ENCOUNTER — Encounter: Admit: 2017-03-17 | Discharge: 2017-03-17 | Payer: MEDICARE

## 2017-03-18 ENCOUNTER — Encounter: Admit: 2017-03-18 | Discharge: 2017-03-18 | Payer: MEDICARE

## 2017-03-18 MED ORDER — DULOXETINE 60 MG PO CPDR
60 mg | ORAL_CAPSULE | Freq: Every day | ORAL | 1 refills
Start: 2017-03-18 — End: ?

## 2017-03-18 MED ORDER — DULOXETINE 60 MG PO CPDR
60 mg | ORAL_CAPSULE | Freq: Every day | ORAL | 0 refills
Start: 2017-03-18 — End: ?

## 2017-04-02 ENCOUNTER — Encounter: Admit: 2017-04-02 | Discharge: 2017-04-02 | Payer: MEDICARE

## 2017-04-02 ENCOUNTER — Ambulatory Visit: Admit: 2017-04-02 | Discharge: 2017-04-02 | Payer: Commercial Managed Care - PPO

## 2017-04-02 DIAGNOSIS — H906 Mixed conductive and sensorineural hearing loss, bilateral: Principal | ICD-10-CM

## 2017-04-02 DIAGNOSIS — F419 Anxiety disorder, unspecified: Principal | ICD-10-CM

## 2017-04-02 DIAGNOSIS — M1710 Unilateral primary osteoarthritis, unspecified knee: ICD-10-CM

## 2017-04-02 DIAGNOSIS — I10 Essential (primary) hypertension: ICD-10-CM

## 2017-04-02 DIAGNOSIS — M47816 Spondylosis without myelopathy or radiculopathy, lumbar region: ICD-10-CM

## 2017-04-02 DIAGNOSIS — M545 Low back pain: ICD-10-CM

## 2017-04-02 DIAGNOSIS — H903 Sensorineural hearing loss, bilateral: Principal | ICD-10-CM

## 2017-04-02 NOTE — Progress Notes
HISTORY OF PRESENT ILLNESS:  Kimberly Phelps is a 66 y.o. female who presents to clinic today for hearing loss.  She was prompted to come in from her husband.  He was feeling she was not listening to him or hearing him as well as she had in the past.  She feels she does fine one-on-one but if he is not in the room more there is background noise she struggles.  She has not noticed any side specific hearing difficulties.  She also was told by her dentist to have [place] look at her lip to see if she had a mucocele as she does bite the area quite regularly.  The area is on the mucosal surface of the lower lip midline.  She denies significant tinnitus or dizziness.  No other neurologic symptoms.  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       Review of Systems   Constitutional: Negative.    HENT: Positive for hearing loss.    Eyes: Negative.    Respiratory: Negative.    Cardiovascular: Negative.    Gastrointestinal: Negative.    Endocrine: Negative.    Genitourinary: Negative.    Musculoskeletal: Positive for back pain.   Skin: Negative.    Allergic/Immunologic: Negative.    Neurological: Negative.         Hx dizziness with no cause noted. Patient denies dizziness today.   Hematological: Negative.    Psychiatric/Behavioral: Negative.    All other systems reviewed and are negative.               ??? acetaminophen (TYLENOL 8 HOUR PO) Take 1 tablet by mouth every 8 hours as needed.   ??? ALPRAZolam (XANAX) 0.25 mg tablet TAKE 1 TABLET BY MOUTH 3 TIMES A DAY AS NEEDED FOR ANXIETY   ??? diclofenac(+) (VOLTAREN) 1 % topical gel Apply 4 g topically to affected area three times daily.   ??? duloxetine DR (CYMBALTA) 60 mg capsule Take 1 capsule by mouth daily.   ??? estradiol(+) (VIVELLE-DOT; ESCLIM) 0.0375 mg/24 hr patch PLACE 1 PATCH EVERY 7 DAYS.   ??? lidocaine (LIDODERM TP) Apply  topically to affected area. ??? lisinopril (PRINIVIL; ZESTRIL) 20 mg tablet Take one tablet by mouth daily.   ??? progesterone, micronized(+) (PROMETRIUM) 100 mg capsule TAKE 1 CAPSULE BY MOUTH DAILY.     There were no vitals filed for this visit.  There is no height or weight on file to calculate BMI.     Physical Exam  The patient is awake and alert no acute distress.  Skin examination was unremarkable no mass or lesion appreciated no evidence of cellulitis.  No evidence of skin cancers.  Extraocular muscle mobility is intact.  No conjunctival hemorrhage. The auricles were without deformity.  External auditory canals are clear no evidence of cerumen fungus or bacterial infection.  Tympanic membranes are without effusion or retraction.  No evidence of perforation.  No cholesteatoma.  The nasal airway shows a straight septum without evidence of perforation or significant crusting.  There are no evidence of polypoid changes.  The inferior turbinate is not congested.  There are no active bleeding sites.  The oral cavity shows normal dentition.  Buccal surfaces are without evidence of lichenoid changes or mucosal disease.  No leukoplakia.  The floor of mouth is without edema.  Wharton's ducts and Stensen's ducts are patent with normal salivary flow.  The tongue is without mass or lesion.  There is normal mobility and sensation.   No significant postnasal drainage.  Uvula is without edema.    Neck was flat no adenopathy or thyromegaly.  No parotid masses or submandibular gland masses.  Cranial nerves are intact bilaterally.  Voice quality is excellent.  No airway distress.  No stridor.  No retractions.      Audiometric studies show a mild sloping to moderately severe sensorineural hearing loss bilaterally with good word recognition on the left and moderate on the right.  Normal tympanograms    ASSESSMENT AND PLAN:       Mildly asymmetric hearing loss right greater than left with worse word recognition on the right side.  We have discussed conservative measures and I would clear her for hearing amplification if she so desires but I do want to see her back in May with a repeat hearing test.  In terms of her concern about a mucocele I do not see evidence of any mucosal changes besides some mild hyperkeratosis to the lower lip.

## 2017-04-19 ENCOUNTER — Encounter: Admit: 2017-04-19 | Discharge: 2017-04-19 | Payer: MEDICARE

## 2017-04-19 ENCOUNTER — Ambulatory Visit: Admit: 2017-04-19 | Discharge: 2017-04-20 | Payer: Private Health Insurance - Indemnity

## 2017-04-19 DIAGNOSIS — M1710 Unilateral primary osteoarthritis, unspecified knee: ICD-10-CM

## 2017-04-19 DIAGNOSIS — M47816 Spondylosis without myelopathy or radiculopathy, lumbar region: ICD-10-CM

## 2017-04-19 DIAGNOSIS — M545 Low back pain: ICD-10-CM

## 2017-04-19 DIAGNOSIS — I10 Essential (primary) hypertension: ICD-10-CM

## 2017-04-19 DIAGNOSIS — F419 Anxiety disorder, unspecified: Principal | ICD-10-CM

## 2017-04-19 DIAGNOSIS — M1712 Unilateral primary osteoarthritis, left knee: Principal | ICD-10-CM

## 2017-04-19 NOTE — Telephone Encounter
Received voicemail from patient stating her new insurance requires a prior authorization on her Voltaren gel and requesting this office do that. Left message for patient to request a refill through her pharmacy and the prior authorization will come to me to complete.

## 2017-04-25 ENCOUNTER — Encounter: Admit: 2017-04-25 | Discharge: 2017-04-25 | Payer: MEDICARE

## 2017-04-25 MED ORDER — OMEPRAZOLE 20 MG PO CPDR
20 mg | ORAL_CAPSULE | Freq: Every day | ORAL | 0 refills | Status: AC
Start: 2017-04-25 — End: 2017-07-26

## 2017-04-25 NOTE — Telephone Encounter
Received [facility] from [facility] requesting Omeprazole 20 mg daily refill  Last OV: June 2018  Routing to Dr. Burnis MedinEsfandyari to approve/deny

## 2017-05-15 ENCOUNTER — Encounter: Admit: 2017-05-15 | Discharge: 2017-05-15 | Payer: MEDICARE

## 2017-05-15 MED ORDER — DULOXETINE 60 MG PO CPDR
60 mg | ORAL_CAPSULE | Freq: Every day | ORAL | 0 refills | 60.00000 days | Status: AC
Start: 2017-05-15 — End: 2017-10-16

## 2017-05-27 ENCOUNTER — Encounter: Admit: 2017-05-27 | Discharge: 2017-05-27 | Payer: MEDICARE

## 2017-05-27 MED ORDER — DULOXETINE 60 MG PO CPDR
60 mg | ORAL_CAPSULE | Freq: Every day | ORAL | 0 refills
Start: 2017-05-27 — End: ?

## 2017-07-17 ENCOUNTER — Encounter: Admit: 2017-07-17 | Discharge: 2017-07-17 | Payer: MEDICARE

## 2017-07-26 ENCOUNTER — Encounter: Admit: 2017-07-26 | Discharge: 2017-07-26 | Payer: MEDICARE

## 2017-07-26 MED ORDER — OMEPRAZOLE 20 MG PO CPDR
20 mg | ORAL_CAPSULE | Freq: Every day | ORAL | 0 refills | Status: AC
Start: 2017-07-26 — End: 2017-08-21

## 2017-08-12 ENCOUNTER — Encounter: Admit: 2017-08-12 | Discharge: 2017-08-12 | Payer: MEDICARE

## 2017-08-15 ENCOUNTER — Encounter: Admit: 2017-08-15 | Discharge: 2017-08-15 | Payer: MEDICARE

## 2017-08-15 DIAGNOSIS — R52 Pain, unspecified: Principal | ICD-10-CM

## 2017-08-15 DIAGNOSIS — M47816 Spondylosis without myelopathy or radiculopathy, lumbar region: ICD-10-CM

## 2017-08-15 DIAGNOSIS — M545 Low back pain: ICD-10-CM

## 2017-08-15 DIAGNOSIS — M1710 Unilateral primary osteoarthritis, unspecified knee: ICD-10-CM

## 2017-08-15 DIAGNOSIS — I10 Essential (primary) hypertension: ICD-10-CM

## 2017-08-15 DIAGNOSIS — F419 Anxiety disorder, unspecified: Principal | ICD-10-CM

## 2017-08-21 ENCOUNTER — Encounter: Admit: 2017-08-21 | Discharge: 2017-08-21 | Payer: MEDICARE

## 2017-08-21 ENCOUNTER — Ambulatory Visit: Admit: 2017-08-21 | Discharge: 2017-08-22 | Payer: MEDICARE

## 2017-08-21 DIAGNOSIS — I10 Essential (primary) hypertension: ICD-10-CM

## 2017-08-21 DIAGNOSIS — M47816 Spondylosis without myelopathy or radiculopathy, lumbar region: ICD-10-CM

## 2017-08-21 DIAGNOSIS — F419 Anxiety disorder, unspecified: Principal | ICD-10-CM

## 2017-08-21 DIAGNOSIS — M545 Low back pain: ICD-10-CM

## 2017-08-21 DIAGNOSIS — M1710 Unilateral primary osteoarthritis, unspecified knee: ICD-10-CM

## 2017-08-21 MED ORDER — METAXALONE 800 MG PO TAB
800 mg | ORAL_TABLET | Freq: Two times a day (BID) | ORAL | 1 refills | Status: AC | PRN
Start: 2017-08-21 — End: 2017-12-18

## 2017-08-21 MED ORDER — MELOXICAM 7.5 MG PO TAB
7.5 mg | ORAL_TABLET | Freq: Two times a day (BID) | ORAL | 1 refills | 30.00000 days | Status: AC
Start: 2017-08-21 — End: 2017-12-18

## 2017-08-22 ENCOUNTER — Encounter: Admit: 2017-08-22 | Discharge: 2017-08-22 | Payer: MEDICARE

## 2017-08-22 DIAGNOSIS — M461 Sacroiliitis, not elsewhere classified: ICD-10-CM

## 2017-08-22 DIAGNOSIS — M47816 Spondylosis without myelopathy or radiculopathy, lumbar region: Principal | ICD-10-CM

## 2017-08-22 DIAGNOSIS — M7918 Myalgia, other site: ICD-10-CM

## 2017-08-22 DIAGNOSIS — M5136 Other intervertebral disc degeneration, lumbar region: ICD-10-CM

## 2017-08-28 ENCOUNTER — Encounter: Admit: 2017-08-28 | Discharge: 2017-08-28 | Payer: MEDICARE

## 2017-09-04 ENCOUNTER — Encounter: Admit: 2017-09-04 | Discharge: 2017-09-04 | Payer: MEDICARE

## 2017-09-04 MED ORDER — DICLOFENAC SODIUM 1 % TP GEL
4 g | Freq: Three times a day (TID) | TOPICAL | 3 refills | 25.00000 days | Status: AC
Start: 2017-09-04 — End: ?

## 2017-09-11 ENCOUNTER — Encounter: Admit: 2017-09-11 | Discharge: 2017-09-11 | Payer: MEDICARE

## 2017-09-11 MED ORDER — PROGESTERONE MICRONIZED 100 MG PO CAP
100 mg | ORAL_CAPSULE | Freq: Every day | ORAL | 1 refills
Start: 2017-09-11 — End: ?

## 2017-10-16 ENCOUNTER — Encounter: Admit: 2017-10-16 | Discharge: 2017-10-16 | Payer: MEDICARE

## 2017-10-16 MED ORDER — DULOXETINE 60 MG PO CPDR
60 mg | ORAL_CAPSULE | Freq: Every day | ORAL | 1 refills | 60.00000 days | Status: AC
Start: 2017-10-16 — End: 2018-04-21

## 2017-10-17 ENCOUNTER — Ambulatory Visit: Admit: 2017-10-17 | Discharge: 2017-10-17 | Payer: MEDICARE

## 2017-10-17 ENCOUNTER — Encounter: Admit: 2017-10-17 | Discharge: 2017-10-17 | Payer: MEDICARE

## 2017-10-17 DIAGNOSIS — F419 Anxiety disorder, unspecified: Principal | ICD-10-CM

## 2017-10-17 DIAGNOSIS — M545 Low back pain: ICD-10-CM

## 2017-10-17 DIAGNOSIS — H903 Sensorineural hearing loss, bilateral: Principal | ICD-10-CM

## 2017-10-17 DIAGNOSIS — I10 Essential (primary) hypertension: ICD-10-CM

## 2017-10-17 DIAGNOSIS — M1710 Unilateral primary osteoarthritis, unspecified knee: ICD-10-CM

## 2017-10-17 DIAGNOSIS — M47816 Spondylosis without myelopathy or radiculopathy, lumbar region: ICD-10-CM

## 2017-10-17 DIAGNOSIS — H6121 Impacted cerumen, right ear: Principal | ICD-10-CM

## 2017-10-18 ENCOUNTER — Encounter: Admit: 2017-10-18 | Discharge: 2017-10-18 | Payer: MEDICARE

## 2017-10-18 DIAGNOSIS — M545 Low back pain: ICD-10-CM

## 2017-10-18 DIAGNOSIS — F419 Anxiety disorder, unspecified: Principal | ICD-10-CM

## 2017-10-18 DIAGNOSIS — M1710 Unilateral primary osteoarthritis, unspecified knee: ICD-10-CM

## 2017-10-18 DIAGNOSIS — M47816 Spondylosis without myelopathy or radiculopathy, lumbar region: ICD-10-CM

## 2017-10-18 DIAGNOSIS — I10 Essential (primary) hypertension: ICD-10-CM

## 2017-10-21 ENCOUNTER — Ambulatory Visit: Admit: 2017-10-21 | Discharge: 2017-10-21 | Payer: MEDICARE

## 2017-10-21 ENCOUNTER — Encounter: Admit: 2017-10-21 | Discharge: 2017-10-21 | Payer: MEDICARE

## 2017-10-21 ENCOUNTER — Ambulatory Visit: Admit: 2017-10-21 | Discharge: 2017-10-22

## 2017-10-21 DIAGNOSIS — I10 Essential (primary) hypertension: ICD-10-CM

## 2017-10-21 DIAGNOSIS — R52 Pain, unspecified: Principal | ICD-10-CM

## 2017-10-21 DIAGNOSIS — M47816 Spondylosis without myelopathy or radiculopathy, lumbar region: ICD-10-CM

## 2017-10-21 DIAGNOSIS — F419 Anxiety disorder, unspecified: Principal | ICD-10-CM

## 2017-10-21 DIAGNOSIS — M545 Low back pain: ICD-10-CM

## 2017-10-21 DIAGNOSIS — M1710 Unilateral primary osteoarthritis, unspecified knee: ICD-10-CM

## 2017-10-21 DIAGNOSIS — M4696 Unspecified inflammatory spondylopathy, lumbar region: Principal | ICD-10-CM

## 2017-10-21 DIAGNOSIS — Z87891 Personal history of nicotine dependence: ICD-10-CM

## 2017-10-21 DIAGNOSIS — Z881 Allergy status to other antibiotic agents status: ICD-10-CM

## 2017-10-21 MED ORDER — IOPAMIDOL 41 % IT SOLN
2.5 mL | Freq: Once | EPIDURAL | 0 refills | Status: CP
Start: 2017-10-21 — End: ?
  Administered 2017-10-21: 18:00:00 2.5 mL via EPIDURAL

## 2017-10-21 MED ORDER — TRIAMCINOLONE ACETONIDE 40 MG/ML IJ SUSP
40 mg | Freq: Once | EPIDURAL | 0 refills | Status: CP
Start: 2017-10-21 — End: ?
  Administered 2017-10-21: 18:00:00 40 mg via EPIDURAL

## 2017-12-16 ENCOUNTER — Encounter: Admit: 2017-12-16 | Discharge: 2017-12-16 | Payer: MEDICARE

## 2017-12-16 DIAGNOSIS — I10 Essential (primary) hypertension: Principal | ICD-10-CM

## 2017-12-16 MED ORDER — LISINOPRIL 20 MG PO TAB
ORAL_TABLET | Freq: Every day | 1 refills | Status: AC
Start: 2017-12-16 — End: 2018-06-23

## 2017-12-18 ENCOUNTER — Encounter: Admit: 2017-12-18 | Discharge: 2017-12-18 | Payer: MEDICARE

## 2017-12-18 ENCOUNTER — Ambulatory Visit: Admit: 2017-12-18 | Discharge: 2017-12-19 | Payer: MEDICARE

## 2017-12-18 DIAGNOSIS — I10 Essential (primary) hypertension: ICD-10-CM

## 2017-12-18 DIAGNOSIS — M1710 Unilateral primary osteoarthritis, unspecified knee: ICD-10-CM

## 2017-12-18 DIAGNOSIS — F419 Anxiety disorder, unspecified: Principal | ICD-10-CM

## 2017-12-18 DIAGNOSIS — M545 Low back pain: ICD-10-CM

## 2017-12-18 DIAGNOSIS — M47816 Spondylosis without myelopathy or radiculopathy, lumbar region: ICD-10-CM

## 2017-12-18 MED ORDER — MELOXICAM 7.5 MG PO TAB
7.5 mg | ORAL_TABLET | Freq: Two times a day (BID) | ORAL | 1 refills | 30.00000 days | Status: AC
Start: 2017-12-18 — End: 2018-05-13

## 2017-12-19 DIAGNOSIS — M47816 Spondylosis without myelopathy or radiculopathy, lumbar region: Principal | ICD-10-CM

## 2017-12-31 ENCOUNTER — Encounter: Admit: 2017-12-31 | Discharge: 2017-12-31 | Payer: MEDICARE

## 2018-01-24 ENCOUNTER — Encounter: Admit: 2018-01-24 | Discharge: 2018-01-24 | Payer: MEDICARE

## 2018-01-24 DIAGNOSIS — Z1231 Encounter for screening mammogram for malignant neoplasm of breast: Principal | ICD-10-CM

## 2018-02-07 ENCOUNTER — Encounter: Admit: 2018-02-07 | Discharge: 2018-02-07 | Payer: MEDICARE

## 2018-02-07 ENCOUNTER — Ambulatory Visit: Admit: 2018-02-07 | Discharge: 2018-02-07 | Payer: MEDICARE

## 2018-02-07 DIAGNOSIS — M1710 Unilateral primary osteoarthritis, unspecified knee: ICD-10-CM

## 2018-02-07 DIAGNOSIS — F419 Anxiety disorder, unspecified: Principal | ICD-10-CM

## 2018-02-07 DIAGNOSIS — M1712 Unilateral primary osteoarthritis, left knee: Principal | ICD-10-CM

## 2018-02-07 DIAGNOSIS — I10 Essential (primary) hypertension: ICD-10-CM

## 2018-02-07 DIAGNOSIS — M545 Low back pain: ICD-10-CM

## 2018-02-07 DIAGNOSIS — M170 Bilateral primary osteoarthritis of knee: ICD-10-CM

## 2018-02-07 DIAGNOSIS — M47816 Spondylosis without myelopathy or radiculopathy, lumbar region: ICD-10-CM

## 2018-02-17 ENCOUNTER — Ambulatory Visit: Admit: 2018-02-17 | Discharge: 2018-02-17 | Payer: MEDICARE

## 2018-02-17 ENCOUNTER — Encounter: Admit: 2018-02-17 | Discharge: 2018-02-17 | Payer: MEDICARE

## 2018-02-17 DIAGNOSIS — Z1231 Encounter for screening mammogram for malignant neoplasm of breast: Principal | ICD-10-CM

## 2018-02-17 DIAGNOSIS — R922 Inconclusive mammogram: Principal | ICD-10-CM

## 2018-02-21 MED ORDER — LIDOCAINE HCL 10 MG/ML (1 %) IJ SOLN
3 mL | Freq: Once | INTRAMUSCULAR | 0 refills | Status: CP
Start: 2018-02-21 — End: ?
  Administered 2018-02-07: 20:00:00 3 mL via INTRAMUSCULAR

## 2018-02-28 ENCOUNTER — Encounter: Admit: 2018-02-28 | Discharge: 2018-02-28 | Payer: MEDICARE

## 2018-03-03 ENCOUNTER — Ambulatory Visit: Admit: 2018-03-03 | Discharge: 2018-03-03 | Payer: MEDICARE

## 2018-03-03 DIAGNOSIS — R922 Inconclusive mammogram: Principal | ICD-10-CM

## 2018-03-11 ENCOUNTER — Encounter: Admit: 2018-03-11 | Discharge: 2018-03-11 | Payer: MEDICARE

## 2018-03-11 ENCOUNTER — Ambulatory Visit: Admit: 2018-03-11 | Discharge: 2018-03-11 | Payer: MEDICARE

## 2018-03-11 DIAGNOSIS — F419 Anxiety disorder, unspecified: Principal | ICD-10-CM

## 2018-03-11 DIAGNOSIS — Z23 Encounter for immunization: Principal | ICD-10-CM

## 2018-03-11 DIAGNOSIS — Z0000 Encounter for general adult medical examination without abnormal findings: ICD-10-CM

## 2018-03-11 DIAGNOSIS — M8588 Other specified disorders of bone density and structure, other site: ICD-10-CM

## 2018-03-11 DIAGNOSIS — I10 Essential (primary) hypertension: ICD-10-CM

## 2018-03-11 DIAGNOSIS — E559 Vitamin D deficiency, unspecified: ICD-10-CM

## 2018-03-11 DIAGNOSIS — M545 Low back pain: ICD-10-CM

## 2018-03-11 DIAGNOSIS — M1710 Unilateral primary osteoarthritis, unspecified knee: ICD-10-CM

## 2018-03-11 DIAGNOSIS — M47816 Spondylosis without myelopathy or radiculopathy, lumbar region: ICD-10-CM

## 2018-03-11 LAB — COMPREHENSIVE METABOLIC PANEL
Lab: 114 mg/dL — ABNORMAL HIGH (ref 70–100)
Lab: 142 MMOL/L (ref 137–147)
Lab: 3.7 MMOL/L (ref 3.5–5.1)

## 2018-03-11 LAB — LIPID PROFILE
Lab: 123 mg/dL (ref <150)
Lab: 157 mg/dL — ABNORMAL HIGH (ref <100)
Lab: 236 mg/dL — ABNORMAL HIGH (ref <200)
Lab: 25 mg/dL (ref 0.3–1.2)

## 2018-03-11 LAB — CBC
Lab: 10 K/UL (ref 4.5–11.0)
Lab: 13 % (ref >60)
Lab: 14 g/dL (ref 12.0–15.0)
Lab: 31 pg (ref 26–34)
Lab: 313 10*3/uL (ref 150–400)
Lab: 32 g/dL (ref >60)
Lab: 4.6 M/UL (ref 4.0–5.0)
Lab: 45 % — ABNORMAL HIGH (ref 36–45)
Lab: 7.7 FL (ref 7–11)
Lab: 96 FL (ref 80–100)

## 2018-03-11 LAB — TSH WITH FREE T4 REFLEX: Lab: 3.6 uU/mL (ref 0.35–5.00)

## 2018-03-12 LAB — 25-OH VITAMIN D (D2 + D3): Lab: 18 ng/mL — ABNORMAL LOW (ref >40)

## 2018-03-15 ENCOUNTER — Encounter: Admit: 2018-03-15 | Discharge: 2018-03-15 | Payer: MEDICARE

## 2018-03-15 DIAGNOSIS — F419 Anxiety disorder, unspecified: Principal | ICD-10-CM

## 2018-03-15 DIAGNOSIS — I10 Essential (primary) hypertension: ICD-10-CM

## 2018-03-15 DIAGNOSIS — M1710 Unilateral primary osteoarthritis, unspecified knee: ICD-10-CM

## 2018-03-15 DIAGNOSIS — M545 Low back pain: ICD-10-CM

## 2018-03-15 DIAGNOSIS — M47816 Spondylosis without myelopathy or radiculopathy, lumbar region: ICD-10-CM

## 2018-03-31 ENCOUNTER — Encounter: Admit: 2018-03-31 | Discharge: 2018-03-31 | Payer: MEDICARE

## 2018-04-19 ENCOUNTER — Encounter: Admit: 2018-04-19 | Discharge: 2018-04-19 | Payer: MEDICARE

## 2018-04-21 MED ORDER — DULOXETINE 60 MG PO CPDR
ORAL_CAPSULE | Freq: Every day | ORAL | 1 refills | 60.00000 days | Status: AC
Start: 2018-04-21 — End: 2018-10-23

## 2018-05-13 ENCOUNTER — Encounter: Admit: 2018-05-13 | Discharge: 2018-05-13 | Payer: MEDICARE

## 2018-05-13 MED ORDER — MELOXICAM 7.5 MG PO TAB
7.5 mg | ORAL_TABLET | Freq: Two times a day (BID) | ORAL | 0 refills | 30.00000 days | Status: AC
Start: 2018-05-13 — End: 2018-08-27

## 2018-06-22 ENCOUNTER — Encounter: Admit: 2018-06-22 | Discharge: 2018-06-22 | Payer: MEDICARE

## 2018-06-22 DIAGNOSIS — I10 Essential (primary) hypertension: Principal | ICD-10-CM

## 2018-06-23 MED ORDER — LISINOPRIL 20 MG PO TAB
ORAL_TABLET | Freq: Every day | 1 refills | Status: AC
Start: 2018-06-23 — End: 2019-04-16

## 2018-07-15 ENCOUNTER — Ambulatory Visit: Admit: 2018-07-15 | Discharge: 2018-07-16 | Payer: MEDICARE

## 2018-07-15 ENCOUNTER — Encounter: Admit: 2018-07-15 | Discharge: 2018-07-15 | Payer: MEDICARE

## 2018-07-15 DIAGNOSIS — M1710 Unilateral primary osteoarthritis, unspecified knee: ICD-10-CM

## 2018-07-15 DIAGNOSIS — I10 Essential (primary) hypertension: ICD-10-CM

## 2018-07-15 DIAGNOSIS — M545 Low back pain: ICD-10-CM

## 2018-07-15 DIAGNOSIS — M47816 Spondylosis without myelopathy or radiculopathy, lumbar region: ICD-10-CM

## 2018-07-15 DIAGNOSIS — F419 Anxiety disorder, unspecified: Principal | ICD-10-CM

## 2018-07-16 DIAGNOSIS — M47816 Spondylosis without myelopathy or radiculopathy, lumbar region: Principal | ICD-10-CM

## 2018-07-21 ENCOUNTER — Encounter: Admit: 2018-07-21 | Discharge: 2018-07-21 | Payer: MEDICARE

## 2018-07-27 ENCOUNTER — Encounter: Admit: 2018-07-27 | Discharge: 2018-07-27 | Payer: MEDICARE

## 2018-07-28 ENCOUNTER — Encounter: Admit: 2018-07-28 | Discharge: 2018-07-28 | Payer: MEDICARE

## 2018-07-28 ENCOUNTER — Ambulatory Visit: Admit: 2018-07-28 | Discharge: 2018-07-28 | Payer: MEDICARE

## 2018-07-28 DIAGNOSIS — M8588 Other specified disorders of bone density and structure, other site: Principal | ICD-10-CM

## 2018-08-13 ENCOUNTER — Encounter: Admit: 2018-08-13 | Discharge: 2018-08-13 | Payer: MEDICARE

## 2018-08-13 NOTE — Telephone Encounter
Received voicemail from patient stating a prior authorization was needed for her Voltarin gel. [employer] called [phone]) and spoke with Robin. This prescription was authorized from 05-12-2018 until 05-14-2019. Patient called and informed the prior authorization was completed and approved.

## 2018-08-18 ENCOUNTER — Encounter: Admit: 2018-08-18 | Discharge: 2018-08-18 | Payer: MEDICARE

## 2018-08-21 ENCOUNTER — Encounter: Admit: 2018-08-21 | Discharge: 2018-08-21 | Payer: MEDICARE

## 2018-08-21 ENCOUNTER — Ambulatory Visit: Admit: 2018-08-21 | Discharge: 2018-08-21 | Payer: MEDICARE

## 2018-08-21 DIAGNOSIS — M47816 Spondylosis without myelopathy or radiculopathy, lumbar region: Principal | ICD-10-CM

## 2018-08-26 ENCOUNTER — Encounter: Admit: 2018-08-26 | Discharge: 2018-08-26 | Payer: MEDICARE

## 2018-08-27 ENCOUNTER — Encounter: Admit: 2018-08-27 | Discharge: 2018-08-27 | Payer: MEDICARE

## 2018-08-27 ENCOUNTER — Ambulatory Visit: Admit: 2018-08-27 | Discharge: 2018-08-27 | Payer: MEDICARE

## 2018-08-27 DIAGNOSIS — M545 Low back pain: ICD-10-CM

## 2018-08-27 DIAGNOSIS — M47816 Spondylosis without myelopathy or radiculopathy, lumbar region: ICD-10-CM

## 2018-08-27 DIAGNOSIS — F419 Anxiety disorder, unspecified: Principal | ICD-10-CM

## 2018-08-27 DIAGNOSIS — M1710 Unilateral primary osteoarthritis, unspecified knee: ICD-10-CM

## 2018-08-27 DIAGNOSIS — I10 Essential (primary) hypertension: ICD-10-CM

## 2018-08-27 MED ORDER — MELOXICAM 7.5 MG PO TAB
7.5 mg | ORAL_TABLET | Freq: Two times a day (BID) | ORAL | 0 refills | 30.00000 days | Status: DC
Start: 2018-08-27 — End: 2018-09-22

## 2018-08-27 MED ORDER — ALPRAZOLAM 0.25 MG PO TAB
.25 mg | ORAL_TABLET | Freq: Three times a day (TID) | ORAL | 3 refills | Status: DC | PRN
Start: 2018-08-27 — End: 2019-09-30

## 2018-08-27 NOTE — Progress Notes
Obtained patient's verbal consent to treat them and their agreement to [facility] financial policy and NPP via this telehealth visit during the [facility]       History of Present Illness  Kimberly Phelps is a 68 y.o. female.    INTERVAL    She wanted to touch base about her low back pain and lateral hip pain. She had  telehealth visit with Dr. Salley Scarlet this AM about her recent MRI L spine. She had multilevel foraminal stenosis. She has mild anterolisthesis in L5-S1.   Lumbar facet arthropathy.  He is recommending she continue conservative therapy with injecitons.     She has seen [facility] in the past for bilateral hip arthritis and at the time was advised she needed a hip replacement. Her hip pain is bilateral and lateral without radiation to the groin.  However her interpretation of her pain is complicated by low back pain.      She has high cholesterol.  ASCVD score 6.3%.  We had advised diet/exercise and recheck.  With covid-19 and low back pain her exercise has been limited. She is starting a new home work-out regimen. She is seeking advice on a diet.     She needs a refill of her xanax.  Her anxiety is worse during current climate.     Her blood pressure was elevated at a recent clinic visit.     CHRONIC  Anxiety  0.25mg  alprazolam tid as needed  Occasional [place] only.   Depression screen neg.    HRT:  Prior: estradiol(+) (VIVELLE-DOT) 0.0375 mg/24 hr patch; Apply 1 patch to top of skin as directed twice weekly.  progesterone, micronized(+) (PROMETRIUM) 100 mg capsule    Hypertension Management:    Current regimen: Lisinopril 10mg   CVD Hx: none  Smoker: No  Outside blood pressures being performed: Yes  BP Readings from Last 3 Encounters:   07/15/18 (!) 141/96   03/11/18 126/84   02/07/18 130/84   Imp: Hypertension not controlled   Lab Results   Component Value Date/Time    MCALBR <0.2 01/17/2012 10:23 AM       Esophagitis and GERD:  Omeprazole 20mg  PO only as needed Negative for joint swelling and myalgias.   Skin: Negative for color change.   Psychiatric/Behavioral: The patient is nervous/anxious.          Objective:         ??? acetaminophen (TYLENOL 8 HOUR PO) Take 1 tablet by mouth every 8 hours as needed.   ??? ALPRAZolam (XANAX) 0.25 mg tablet TAKE 1 TABLET BY MOUTH 3 TIMES A DAY AS NEEDED FOR ANXIETY   ??? diclofenac (VOLTAREN) 1 % topical gel Apply four g topically to affected area three times daily.   ??? duloxetine DR (CYMBALTA) 60 mg capsule TAKE 1 CAPSULE BY MOUTH EVERY DAY   ??? lidocaine (LIDODERM TP) Apply  topically to affected area.   ??? lisinopril (ZESTRIL) 20 mg tablet TAKE 1 TABLET BY MOUTH EVERY DAY   ??? meloxicam (MOBIC) 7.5 mg tablet Take one tablet by mouth twice daily.     Vitals:    08/27/18 0957   Weight: 75.8 kg (167 lb)   PainSc: Two     Vitals:    08/27/18 0957   Weight: 75.8 kg (167 lb)   PainSc: Two       Body mass index is 29.58 kg/m???.     Physical Exam  Vitals signs and nursing note reviewed.  Constitutional:       Appearance: Normal appearance. She is well-developed.   HENT:      Head: Normocephalic and atraumatic.   Eyes:      Conjunctiva/sclera: Conjunctivae normal.   Pulmonary:      Effort: Pulmonary effort is normal.   Abdominal:      General: Bowel sounds are normal.   Musculoskeletal: Normal range of motion.   Skin:     General: Skin is warm and dry.   Neurological:      Mental Status: She is alert.   Psychiatric:         Mood and Affect: Mood normal.         Behavior: Behavior normal.         Thought Content: Thought content normal.         Labwork reviewed:  Lab Results   Component Value Date/Time    MCALBR <0.2 01/17/2012 10:23 AM    TSH 3.63 03/11/2018 03:39 PM    TSH3G 0.55 03/26/2016 09:09 AM    FREET4R 1.1 11/03/2010 02:15 PM    CHOL 236 (H) 03/11/2018 03:39 PM    TRIG 123 03/11/2018 03:39 PM    HDL 75 03/11/2018 03:39 PM    LDL 157 (H) 03/11/2018 03:39 PM    NA 142 03/11/2018 03:39 PM    K 3.7 03/11/2018 03:39 PM CL 107 03/11/2018 03:39 PM    CO2 26 03/11/2018 03:39 PM    GAP 9 03/11/2018 03:39 PM    BUN 20 03/11/2018 03:39 PM    CR 0.82 03/11/2018 03:39 PM    GLU 114 (H) 03/11/2018 03:39 PM    GLU 72 09/12/2016 03:24 PM    CA 9.8 03/11/2018 03:39 PM    ALBUMIN 4.5 03/11/2018 03:39 PM    TOTPROT 7.2 03/11/2018 03:39 PM    ALKPHOS 74 03/11/2018 03:39 PM    AST 21 03/11/2018 03:39 PM    ALT 16 03/11/2018 03:39 PM    TOTBILI 0.3 03/11/2018 03:39 PM    GFR >60 03/11/2018 03:39 PM    GFRAA >60 03/11/2018 03:39 PM            Assessment and Plan:  Britta Mccreedy A. 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  She is requesting a re-evaluation of her OA given her persistent symptoms. I think that is reasonable to re-assess.  I recommend referral to Dr. Hattie Perch if significant OA noted on imaging.  > R and L hip Xrays    Hyperlipidemia  Intermediate ASCVD score discussed in October and today.  Discussed importance of aerobic exercise and Mediterrenean diet.  We discussed tools for success.   > recheck lipids at annual exam in October.       HTN (hypertension), benign:  Above goal at a recent visit with Dr. Salley Scarlet.  > continue lisinopril 20  > monitor at local pharmacy, call if > 140/90     Lumbar facet arthropathy: Chronic and doing well w/ aqua therapy.  Reviewed imaging and recommendations of Dr. Salley Scarlet with her.   > continue duloxetine  > continue PT    Anxiety  Chronic and stable despite covid-19  > refill alprazolam prn  There are no Patient Instructions on file for this visit.    No follow-ups on file.

## 2018-08-28 ENCOUNTER — Ambulatory Visit: Admit: 2018-08-27 | Discharge: 2018-08-28 | Payer: MEDICARE

## 2018-08-28 DIAGNOSIS — F419 Anxiety disorder, unspecified: ICD-10-CM

## 2018-08-28 DIAGNOSIS — M1611 Unilateral primary osteoarthritis, right hip: ICD-10-CM

## 2018-08-28 DIAGNOSIS — I10 Essential (primary) hypertension: ICD-10-CM

## 2018-08-28 DIAGNOSIS — E782 Mixed hyperlipidemia: ICD-10-CM

## 2018-08-28 DIAGNOSIS — M47816 Spondylosis without myelopathy or radiculopathy, lumbar region: Principal | ICD-10-CM

## 2018-09-21 ENCOUNTER — Encounter: Admit: 2018-09-21 | Discharge: 2018-09-21 | Payer: MEDICARE

## 2018-09-22 MED ORDER — MELOXICAM 7.5 MG PO TAB
ORAL_TABLET | Freq: Two times a day (BID) | ORAL | 0 refills | 30.00000 days | Status: DC
Start: 2018-09-22 — End: 2018-10-22

## 2018-10-08 ENCOUNTER — Ambulatory Visit: Admit: 2018-10-08 | Discharge: 2018-10-09 | Payer: MEDICARE

## 2018-10-08 ENCOUNTER — Encounter: Admit: 2018-10-08 | Discharge: 2018-10-08 | Payer: MEDICARE

## 2018-10-08 DIAGNOSIS — F419 Anxiety disorder, unspecified: Principal | ICD-10-CM

## 2018-10-08 DIAGNOSIS — M545 Low back pain: ICD-10-CM

## 2018-10-08 DIAGNOSIS — I10 Essential (primary) hypertension: ICD-10-CM

## 2018-10-08 DIAGNOSIS — M47816 Spondylosis without myelopathy or radiculopathy, lumbar region: ICD-10-CM

## 2018-10-08 DIAGNOSIS — M1710 Unilateral primary osteoarthritis, unspecified knee: ICD-10-CM

## 2018-10-09 ENCOUNTER — Encounter: Admit: 2018-10-09 | Discharge: 2018-10-09 | Payer: MEDICARE

## 2018-10-09 DIAGNOSIS — M47816 Spondylosis without myelopathy or radiculopathy, lumbar region: Principal | ICD-10-CM

## 2018-10-19 ENCOUNTER — Encounter: Admit: 2018-10-19 | Discharge: 2018-10-19

## 2018-10-22 MED ORDER — MELOXICAM 7.5 MG PO TAB
ORAL_TABLET | Freq: Two times a day (BID) | ORAL | 0 refills | 30.00000 days | Status: DC
Start: 2018-10-22 — End: 2018-11-17

## 2018-10-23 ENCOUNTER — Encounter: Admit: 2018-10-23 | Discharge: 2018-10-23

## 2018-10-23 MED ORDER — DULOXETINE 60 MG PO CPDR
ORAL_CAPSULE | Freq: Every day | ORAL | 1 refills | 60.00000 days | Status: DC
Start: 2018-10-23 — End: 2019-05-28

## 2018-10-23 NOTE — Telephone Encounter
Pt had 100% relief form injection done 10/21/2017 and that relief lasted over 9 months. She would like to get it repeated by Dr Tamala Julian as recommended by Dr Rosalio Macadamia. We will try and expedite an appeal through [place].

## 2018-10-23 NOTE — Telephone Encounter
Pharmacy faxed refill request on cymbalta. It is a Standing order and the patient meets the Standing order requirements. I faxed the request back to the pharmacy. Kwamaine Cuppett, RN

## 2018-10-24 ENCOUNTER — Encounter: Admit: 2018-10-24 | Discharge: 2018-10-24

## 2018-10-24 NOTE — Telephone Encounter
This RN spoke with patient and let her know that due to her upcoming procedure being appealed through her insurance that her procedure would need to be cancelled/rescheduled at this time. Patient was on a work call and given direct scheduling line to call back to reschedule. Verbalized understanding.

## 2018-11-03 ENCOUNTER — Encounter: Admit: 2018-11-03 | Discharge: 2018-11-03

## 2018-11-03 DIAGNOSIS — R52 Pain, unspecified: Secondary | ICD-10-CM

## 2018-11-03 NOTE — Telephone Encounter
Spoke this morning with patient and let her know that due to her insurance not approving her procedure, that the procedure had been cancelled. Let her know that Dr. Melody Comas office is handling the appeal process as she is a patient of Dr. Rosalio Macadamia.

## 2018-11-03 NOTE — Telephone Encounter
Attempted to call both cell and home phone numbers to discuss cancelled procedure. Left message to call this office.

## 2018-11-04 ENCOUNTER — Encounter: Admit: 2018-11-04 | Discharge: 2018-11-04

## 2018-11-04 NOTE — Telephone Encounter
[employer] and spoke with [place] regarding appeal for procedure. Directed to [email or web address] for paperwork to complete for appeal. Paperwork completed and faxed to [facility] for appeal of bilateral L5-S1 facet steroid injections. Faxed to [phone], fax confirmation received. Patient notified of above.

## 2018-11-06 NOTE — Telephone Encounter
Resent appeal paperwork with signed authorization from patient to [facility]

## 2018-11-07 ENCOUNTER — Encounter: Admit: 2018-11-07 | Discharge: 2018-11-07

## 2018-11-07 NOTE — Telephone Encounter
Received call from [place] with [facility] appeals department stating they had received the information and will let us know within 30 days of the decision. Patient called and informed of this information.

## 2018-11-11 ENCOUNTER — Encounter: Admit: 2018-11-11 | Discharge: 2018-11-11

## 2018-11-11 DIAGNOSIS — M47816 Spondylosis without myelopathy or radiculopathy, lumbar region: Secondary | ICD-10-CM

## 2018-11-11 DIAGNOSIS — M5136 Other intervertebral disc degeneration, lumbar region: Secondary | ICD-10-CM

## 2018-11-11 NOTE — Telephone Encounter
Received call from Manuela Schwartz with [employer] [facility] stating the appeal for the procedure was approved. Authorization number [id number], with dates good from 11-10-2018 to Sept 29, 2020. Patient notified and will call scheduling line.

## 2018-11-16 ENCOUNTER — Encounter: Admit: 2018-11-16 | Discharge: 2018-11-16

## 2018-11-17 MED ORDER — MELOXICAM 7.5 MG PO TAB
ORAL_TABLET | Freq: Two times a day (BID) | ORAL | 0 refills | 30.00000 days | Status: AC
Start: 2018-11-17 — End: ?

## 2018-11-17 NOTE — Telephone Encounter
Pharmacy e-script for refill of Mobic 7.5 mg po, 1 tab BID, disp 60, no refills (include medication name, dosage, route, frequency, and quantity).   Date of last refill per chart or pharmacy 10/22/2018.   [facility], [phone]  Provider Rosalio Macadamia  Last office visit 10/08/2018.   Follow up appointment 12/08/2018.    Provider [facility]  Provider notified No   Refilled per protocol.

## 2018-12-08 ENCOUNTER — Encounter: Admit: 2018-12-08 | Discharge: 2018-12-08

## 2018-12-08 ENCOUNTER — Ambulatory Visit: Admit: 2018-12-08 | Discharge: 2018-12-08

## 2018-12-08 DIAGNOSIS — R52 Pain, unspecified: Secondary | ICD-10-CM

## 2018-12-08 DIAGNOSIS — F419 Anxiety disorder, unspecified: Secondary | ICD-10-CM

## 2018-12-08 DIAGNOSIS — I10 Essential (primary) hypertension: Secondary | ICD-10-CM

## 2018-12-08 DIAGNOSIS — M5136 Other intervertebral disc degeneration, lumbar region: Secondary | ICD-10-CM

## 2018-12-08 DIAGNOSIS — M1710 Unilateral primary osteoarthritis, unspecified knee: Secondary | ICD-10-CM

## 2018-12-08 DIAGNOSIS — M47816 Spondylosis without myelopathy or radiculopathy, lumbar region: Secondary | ICD-10-CM

## 2018-12-08 DIAGNOSIS — M545 Low back pain: Secondary | ICD-10-CM

## 2018-12-08 MED ORDER — TRIAMCINOLONE ACETONIDE 40 MG/ML IJ SUSP
40 mg | Freq: Once | EPIDURAL | 0 refills | Status: CP
Start: 2018-12-08 — End: ?

## 2018-12-08 MED ORDER — IOHEXOL 300 MG IODINE/ML IV SOLN
2 mL | Freq: Once | 0 refills | Status: CP
Start: 2018-12-08 — End: ?

## 2018-12-08 MED ORDER — LIDOCAINE (PF) 10 MG/ML (1 %) IJ SOLN
2 mL | Freq: Once | INTRAMUSCULAR | 0 refills | Status: CP
Start: 2018-12-08 — End: ?

## 2018-12-08 NOTE — Progress Notes
[facility]ENTER  INTERVENTIONAL PAIN PROCEDURE HISTORY AND PHYSICAL    Chief Complaint   Patient presents with   ??? Procedure       HISTORY OF PRESENT ILLNESS:  Kimberly Phelps is a 68 y.o. year old female who presents for injection.  Denies fevers, chills, or recent hospitalizations.  No allergies to contrast, latex, seafood, iodine, or shellfish.  Patient denies blood thinning medications.        Medical History:   Diagnosis Date   ??? Anxiety    ??? Chronic lower back pain    ??? HTN (hypertension)    ??? Lumbar facet arthropathy    ??? Patellofemoral arthritis        Surgical History:   Procedure Laterality Date   ??? BARTHOLIN GLAND CYST EXCISION  1978   ??? ELBOW SURGERY Right 1989    scar tissue removal for tendonitis   ??? HYDROGEN BREATH TEST N/A 12/12/2016    Performed by Jolee Ewing, MD at [facility]   ??? COLONOSCOPY N/A 12/27/2016    Performed by Jolee Ewing, MD at [facility]   ??? ESOPHAGOGASTRODUODENOSCOPY w/ bxs N/A 12/27/2016    Performed by Jolee Ewing, MD at [facility] OR       family history includes Basal Cell Carcinoma in her father; Cancer in her father; Essie Christine (age of onset: 15) in her mother; Depression in her sister; Hypertension in her father and mother; Migraines in her paternal grandmother and sister; Other in her father and mother; Seizures in her paternal uncle; Thyroid Disease in her sister.    Social History     Socioeconomic History   ??? Marital status: Married     Spouse name: Not on file   ??? Number of children: 0   ??? Years of education: Not on file   ??? Highest education level: Not on file   Occupational History   ??? Occupation: Chiropractor: [employer]   Tobacco Use   ??? Smoking status: Former Smoker     Packs/day: 1.00     Years: 20.00     Pack years: 20.00     Types: Cigarettes     Last attempt to quit: 11/03/1995     Years since quitting: 23.1   ??? Smokeless tobacco: Never Used   ??? Tobacco comment: none x 12 years   Substance and Sexual Activity   ??? Alcohol use: Yes Alcohol/week: 14.0 standard drinks     Types: 8 Glasses of wine, 6 Standard drinks or equivalent per week   ??? Drug use: No   ??? Sexual activity: Not on file   Other Topics Concern   ??? Military Service Not Asked   ??? Blood Transfusions Not Asked   ??? Caffeine Concern Yes     Comment: 1-2 cups per day   ??? Occupational Exposure Not Asked   ??? Hobby Hazards Not Asked   ??? Sleep Concern Not Asked   ??? Stress Concern Not Asked   ??? Weight Concern Not Asked   ??? Special Diet Not Asked   ??? Back Care Not Asked   ??? Exercise Yes     Comment: 30- 40 minutes per day   ??? Bike Helmet Not Asked   ??? Seat Belt Not Asked   ??? Self-Exams Not Asked   Social History Narrative   ??? Not on file       Allergies   Allergen Reactions   ??? Erythromycin NAUSEA AND VOMITING  There were no vitals filed for this visit.    REVIEW OF SYSTEMS: 10 point ROS obtained and negative except for back pain      PHYSICAL EXAM:  General: 68 y.o. female appears stated age, in no acute distress  HEENT: Normocephalic, atraumatic  Neck: No thyroidmegaly  Cardiovascular: Well perfused  Pulmonary: Unlabored respirations  Extremities: No cyanosis, clubbing, or edema  Skin: No lesions seen on exposed skin  Psychiatric:  Appropriate mood and affect  Musculoskeletal: No atrophy.   Neurologic: Antigravity strength in all extremities. CN II -XII grossly intact.  Alert and oriented x 3.         IMPRESSION:    1. Lumbar facet arthropathy    2. Spondylosis without myelopathy or radiculopathy, lumbar region     3. Degeneration of intervertebral disc of lumbar region         PLAN:   Bilateral L5-S1 facet injections

## 2018-12-08 NOTE — Procedures
Attending Surgeon: Curly Rim, MD    Anesthesia: Local    Pre-Procedure Diagnosis:   1. Lumbar facet arthropathy    2. Spondylosis without myelopathy or radiculopathy, lumbar region     3. Degeneration of intervertebral disc of lumbar region        Post-Procedure Diagnosis:   1. Lumbar facet arthropathy    2. Spondylosis without myelopathy or radiculopathy, lumbar region     3. Degeneration of intervertebral disc of lumbar region        Medial Branch Block/Facet Lumbar/Sacral  Procedure: paravertebral facet    Laterality: bilateral    Location: lumbar - L5-S1      Consent:   Consent obtained: verbal and written  Consent given by: patient  Risks discussed: allergic reaction, bleeding, bruising, nerve damage, infection, no change or worsening in pain and reaction to medication    Discussed with patient the purpose of the treatment/procedure, other ways of treating my condition, including no treatment/ procedure and the risks and benefits of the alternatives. Patient has decided to proceed with treatment/procedure.        Universal Protocol:  Relevant documents: relevant documents present and verified  Test results: test results available and properly labeled  Imaging studies: imaging studies available  Required items: required blood products, implants, devices, and special equipment available  Site marked: the operative site was marked  Patient identity confirmed: Patient identify confirmed verbally with patient.        Time out: Immediately prior to procedure a time out was called to verify the correct patient, procedure, equipment, support staff and site/side marked as required      Procedures Details:   Indications: pain and diagnostic evaluation   Prep: chlorhexidine  Patient position: prone  Estimated Blood Loss: minimal  Specimens: none  Number of Levels: 1  Guidance: fluoroscopy  Needle and Epidural Catheter: quincke  Needle size: 25 G  Injection procedure: Negative aspiration for blood Patient tolerance: Patient tolerated the procedure well with no immediate complications. Pressure was applied, and hemostasis was accomplished.  Comments: DESCRIPTION OF PROCEDURE:  The procedure risks and benefits were explained and informed consent was obtained from the patient.  The patient was placed in prone position on fluoroscopy table.  Blood pressure cuff and oxygen saturation monitors were attached and the patient was monitored throughout the procedure. The skin was prepped using Chlorhexadine and draped in aseptic fashion.  The C-arm was rotated obliquely towards the right side to visualize the right L5-S1 facet joint.  The skin and subcutaneous tissue was anesthetized using 2 mL of 1 percent lidocaine with 27-gauge needle.  A 3.5 inch 25-gauge spinal needle was advanced parallel to the x-ray beam towards the right L5-S1 facet joint.  The tip of the needle made contact with the bone and was slowly walked off and inserted into the facet joint.  After negative aspiration, 0.2 mL of Isovue contrast dye was injected and demonstrated articular flow.  Subsequently, a solution containing. 0.5 mL of 1 percent lidocaine and 20 mg of triamcinolone was injected into the right L5-S1 facet.  The stylet was reinserted and the needle was then removed.     Subsequently, attention was taken to the left side, where the left L5-S1 facet joint were visualized with fluoroscopy in the oblique view.  The skin and subcutaneous tissue was anesthetized using 2 mL of 1 percent lidocaine with 27-gauge needle.  A 3.5 inch 25-gauge spinal needle was advanced parallel to the x-ray beam towards the left  L5-S1 facet joint.  The needles were advanced slowly until the tip of the needle made contact with the bone and was subsequently walked off and advanced into the facet joint.   After negative aspiration, 0.2 mL of Isovue contrast dye was injected and arthrogram was seen.  A mixture containing 0.5 mL of 1 percent lidocaine and 20 mg of triamcinolone was injected into the left L5-S1 facet joint.  The stylet was reinserted and needle was then removed.  There is no sensory or motor deficit of extremity noted.  After the procedure, the patient's blood pressure, heart rate, and oxygen saturation were recorded in the chart.     There were no complications.  The patient tolerated the procedure well and was brought to the recovery room for observation in stable condition and discharged with written discharge instructions.     PLAN OF CARE:  The patient is to followup in 6 weeks.    The patient was advised to contact the [facility] for any of the following    Fever, chills, or night sweats.  New onset of severe, sharp pain.  Any new upper or lower extremity weakness or numbness.  Any questions regarding the procedure.    If unable to contact the [facility], the patient instructed to go to the local emergency room.           Estimated blood loss: none or minimal  Specimens: none  Patient tolerated the procedure well with no immediate complications. Pressure was applied, and hemostasis was accomplished.

## 2018-12-08 NOTE — Discharge Instructions - Supplementary Instructions
..  GENERAL POST PROCEDURE INSTRUCTIONS    Physician: _________________________________    Procedure Completed Today:  o Joint Injection (hip, knee, shoulder)  o Cervical Epidural Steroid Injection  o Cervical Transforaminal Steroid Injection  o Trigger Point Injection  o Other___________________________ o Thoracic Epidural Steroid Injection  o Lumbar Epidural Steroid Injection  o Lumbar Transforaminal Steroid Injection  o Facet Joint Injection     Important information following your procedure today:  o You may drive today     o If you had sedation, you may NOT drive today  ? Rest at home for the next 6 hours.  You may then begin to resume your normal activities.  ? DO NOT drive any vehicle, operate any power tools, drink alcohol, make any major decisions, or sign any legal documents for the next 12 hours.  1. Pain relief may not be immediate. It is possible you may even experience an increase in pain during the first 24-48 hours followed by a gradual decrease of your pain.  2. Though the procedure is generally safe and complications are rare, we do ask that you be aware of any of the following:  ? Any swelling, persistent redness, new bleeding or drainage from the site of the injection.  ? You should not experience a severe headache.  ? You should not run a fever over 101oF.  ? New onset of sharp, severe back and or neck pain.  ? New onset of upper or lower extremity numbness or weakness.  ? New difficulty controlling bowel or bladder function after injection.  ? New shortness of breath.  If any of these occur, please call to report this occurrence to a nurse at [phone]. If you are calling after 4:00pm, on the weekends or holidays please call [phone] and ask to have the pain management resident physician on call for the physician paged or go to your local emergency room.   3. You may experience soreness at the injection site. Ice can be applied at 20 minute intervals for the first 24 hours. The following day you may alternate ice with heat if you are experiencing muscle tightness, otherwise continue with ice. Ice works best at decreasing pain. Avoid application of direct heat, hot showers or hot tubs today.  4. Avoid strenuous activity today. You many resume your regular activities and exercise tomorrow.  5. Patients with diabetes may see an elevation in blood sugars for 7-10 days after the injection. It is important to pay close attention to your diet, check your blood sugars daily and repost extreme elevations to the physician that treats your diabetes.  6. Patients taking daily blood thinners can resume their regular dose this evening.  7. It is important that you take all medications ordered by your pain physician. Taking medications as ordered is an important part of you pain care plan. If you cannot continue the medication plan, please notify the physician.    Possible side effects to steroids that may occur:  ? Flushing or redness of the face  ? Irritability  ? Fluid retention  ? Change in women's menses      Follow up appointment as needed if in the event you are unable to keep an appointment please notify the scheduler 24 hours in advance at [phone]. If you have questions for the surgery center, call [facility] at [phone].

## 2019-01-22 ENCOUNTER — Encounter: Admit: 2019-01-22 | Discharge: 2019-01-22

## 2019-01-22 ENCOUNTER — Ambulatory Visit: Admit: 2019-01-22 | Discharge: 2019-01-23

## 2019-01-22 DIAGNOSIS — M47816 Spondylosis without myelopathy or radiculopathy, lumbar region: Principal | ICD-10-CM

## 2019-01-22 DIAGNOSIS — F419 Anxiety disorder, unspecified: Secondary | ICD-10-CM

## 2019-01-22 DIAGNOSIS — M5136 Other intervertebral disc degeneration, lumbar region: Secondary | ICD-10-CM

## 2019-01-22 DIAGNOSIS — M545 Low back pain: Secondary | ICD-10-CM

## 2019-01-22 DIAGNOSIS — I10 Essential (primary) hypertension: Secondary | ICD-10-CM

## 2019-01-22 DIAGNOSIS — M1710 Unilateral primary osteoarthritis, unspecified knee: Secondary | ICD-10-CM

## 2019-01-22 NOTE — Progress Notes
[facility]INE CENTER CLINIC NOTE       SUBJECTIVE:   Patient is a 68 year old female with past medical history of anxiety and hypertension who presents today for follow-up of axial chronic low back pain.  She has been followed in this clinic and has been diagnosed with lumbar facet arthropathy.  She was last seen on 12/08/2018 at which time she received bilateral L5-S1 facet intra-articular steroid injections.    Patient reports about a 50% improvement in her pain.  As a review, patient reports pain located bilateral low back and bilateral lateral hips  Patient describes describes the pain as constant and characterizes it as dull, aching pain.  The pain does not radiate.  Aggravating factors include bending forward and walking.  Alleviating factors include rest/sitting.  Patient rates the pain as 6/10 today on the VAS pain scale.           Review of Systems   Constitutional: Positive for activity change.   HENT: Negative.    Eyes: Negative.    Respiratory: Negative.    Cardiovascular: Negative.    Gastrointestinal: Negative.    Endocrine: Negative.    Genitourinary: Negative.    Musculoskeletal: Positive for back pain.   Skin: Negative.    Allergic/Immunologic: Negative.    Neurological: Negative.    Hematological: Negative.    Psychiatric/Behavioral: Negative.    All other systems reviewed and are negative.      Current Outpatient Medications:   ???  acetaminophen (TYLENOL 8 HOUR PO), Take 1 tablet by mouth every 8 hours as needed., Disp: , Rfl:   ???  ALPRAZolam (XANAX) 0.25 mg tablet, Take one tablet by mouth three times daily as needed for Anxiety., Disp: 30 tablet, Rfl: 3  ???  diclofenac (VOLTAREN) 1 % topical gel, Apply four g topically to affected area three times daily., Disp: 300 g, Rfl: 3  ???  duloxetine DR (CYMBALTA) 60 mg capsule, TAKE 1 CAPSULE BY MOUTH EVERY DAY, Disp: 90 capsule, Rfl: 1  ???  lidocaine (LIDODERM TP), Apply  topically to affected area., Disp: , Rfl: ???  lisinopril (ZESTRIL) 20 mg tablet, TAKE 1 TABLET BY MOUTH EVERY DAY, Disp: 90 tablet, Rfl: 1  ???  meloxicam (MOBIC) 7.5 mg tablet, TAKE 1 TABLET BY MOUTH TWICE A DAY, Disp: 60 tablet, Rfl: 0     Allergies   Allergen Reactions   ??? Erythromycin NAUSEA AND VOMITING     Physical Exam  Vitals:    01/22/19 0916   BP: (!) 144/88   BP Source: Arm, Right Upper   Patient Position: Sitting   Pulse: 96   Temp: 36.3 ???C (97.3 ???F)   TempSrc: Oral   SpO2: 97%   Weight: 74.4 kg (164 lb)   Height: 160 cm (63)   PainSc: Six     Oswestry Total Score:: 42  Pain Score: Six  Body mass index is 29.05 kg/m???.    Constitutional: no acute distress, appears stated age  ENMT: supple  Cardiovascular: Well perfused  Respiratory: normal effort with respirations  Gastrointestinal: nondistended  Extremities: No edema  Skin: Warm and dry  Psychiatric:  Appropriate mood and affect    Neuro:  MS:   Root Right Left   Hip Flexion L2 5 5   Knee Extension L3 5 5   Dorsiflexion L4 5 5   Plantarflexion S1 5 5   EHL Extension L5 5 4     Bilateral hip abduction 4/5.    Sensation: intact to light touch  all BLE dermatomes.  Reflexes:  2+ right patella, 2+ left patella                 2+ right achilles, 2+ left achilles  Negative Clonus bilaterally    MSK:  Inspection - no atrophy or swelling  Palpation - mildly ttp bilateral L5-S1 facets, L > R.  Also ttp greater trochanter and along gluteus medius muscle bilaterally.  No other tenderness to palpation along the spinous process, facet joints, paraspinal musculature, SI joints, gluteal musculature, greater trochanters.    ROM:  Patient is able to forward flex to knees, and able to extend without significant pain.    Full ROM bilateral lower extremities.      Special tests:  Negative:  Straight leg testing to 30-75 degrees.    Facet loading         IMPRESSION:  1. Lumbar facet arthropathy    2. Chronic bilateral low back pain without sciatica    3. Lumbar degenerative disc disease 4. Greater trochanteric pain syndrome        68 year old female with axial low back pain with 50% relief of pain s/p bilateral L5-S1 intraarticular facet injections 6 weeks ago.  History and exam are consistent with lumbar facet arthropathy L5-S1 bilaterally, as well as bilateral greater trochanteric bursitis.    PLAN:    1.  Lifestyle modification.  Continue current activities as tolerated.    2.  Medication.  Continue current home medications.  Encouraged her to use Voltaren gel on lateral hips/greater trochanter as well.  3.  Therapy.  Continue with home exercises.  Encouraged her to do them more frequently.  Handout provided with exercises for IT band stretching and strengthening of hip abductors and extensors.  4.  Interventions.  We discussed continuing with facet injections vs MBB/RFA in the setting of good relief of pain in the past with just facet injections.  Given that she had great response to previous injections, we will get her scheduled for repeat bilateral L5-S1 facet injections.  If that is unsuccessful again, then we can proceed with MBB/RFA.  5.  Diagnostics.  No new imaging to be ordered at this time.  6.  Follow-up.  Patient will follow-up for repeat facet injections.    Patient was seen and discussed with Dr. Katrinka Blazing.    Edd Fabian, M.D.  Interventional Musculoskeletal and Spine Fellow  Physical Medicine and Rehabilitation     ATTESTATION    I personally performed the key portions of the E/M visit, discussed case with fellow and concur with fellow documentation of history, physical exam, assessment, and treatment plan unless otherwise noted.    Ms. Deida is a very pleasant 68 year old female with history of lumbar spondylosis and hypertension, presents for follow-up on back pain management and evaluation of the lateral hip pain.  Patient was last seen on 12/08/2018, at which point we provided her with intra-articular facet injections at L5-S1.  Patient states that she had greater than 50% reduction pain.  Previously she had an injection performed in June 2019, which provided almost completely relief for 9 to 10 months.  Pain continues to be in the low back.  She denies radiation down the legs.  She does have pain in the lateral hips and has questions as to whether this is related today.  VAS pain score is rated as a 6/10.  Pain is typically worse with standing or walking.  Pain is better with sitting.    On examination  she demonstrates tenderness of bilateral greater trochanter.  She has mild tenderness at L5-S1 facet joints.  Facet loading is not grossly positive.  She demonstrates a mild EHL weakness on the right.    Ms. Alzate is a 68 year old female who presents with slightly improved axial low back pain due to lumbar spondylosis and increasing bilateral lateral hip pain secondary to trochanteric pain syndrome.  We discussed options with the patient, she is elected to proceed with repeat bilateral L5-S1 intra-articular facet injections.  If we can get 6 months of relief, I be okay with continuing these injections sporadically.  If she does not get at least 6 months relief, I recommend medial branch blocks and possible radiofrequency ablation.  I also provided her with hip abductor strengthening exercises to help with her lateral hip pain.  I will see her back for procedure.    Staff name: Curly Rim, MD  Date: 01/22/19

## 2019-01-23 DIAGNOSIS — M25559 Pain in unspecified hip: Secondary | ICD-10-CM

## 2019-01-23 DIAGNOSIS — G8929 Other chronic pain: Secondary | ICD-10-CM

## 2019-01-23 DIAGNOSIS — M545 Low back pain: Secondary | ICD-10-CM

## 2019-02-12 ENCOUNTER — Encounter: Admit: 2019-02-12 | Discharge: 2019-02-12 | Payer: MEDICARE

## 2019-02-27 ENCOUNTER — Ambulatory Visit: Admit: 2019-02-27 | Discharge: 2019-02-27 | Payer: MEDICARE

## 2019-02-27 ENCOUNTER — Encounter: Admit: 2019-02-27 | Discharge: 2019-02-27 | Payer: MEDICARE

## 2019-02-27 DIAGNOSIS — M545 Low back pain: Secondary | ICD-10-CM

## 2019-02-27 DIAGNOSIS — M47816 Spondylosis without myelopathy or radiculopathy, lumbar region: Secondary | ICD-10-CM

## 2019-02-27 DIAGNOSIS — Z0000 Encounter for general adult medical examination without abnormal findings: Secondary | ICD-10-CM

## 2019-02-27 DIAGNOSIS — F419 Anxiety disorder, unspecified: Secondary | ICD-10-CM

## 2019-02-27 DIAGNOSIS — Z23 Encounter for immunization: Secondary | ICD-10-CM

## 2019-02-27 DIAGNOSIS — I10 Essential (primary) hypertension: Secondary | ICD-10-CM

## 2019-02-27 DIAGNOSIS — E559 Vitamin D deficiency, unspecified: Secondary | ICD-10-CM

## 2019-02-27 DIAGNOSIS — E782 Mixed hyperlipidemia: Secondary | ICD-10-CM

## 2019-02-27 DIAGNOSIS — R922 Inconclusive mammogram: Secondary | ICD-10-CM

## 2019-02-27 DIAGNOSIS — Z1231 Encounter for screening mammogram for malignant neoplasm of breast: Secondary | ICD-10-CM

## 2019-02-27 DIAGNOSIS — M1710 Unilateral primary osteoarthritis, unspecified knee: Secondary | ICD-10-CM

## 2019-02-27 LAB — COMPREHENSIVE METABOLIC PANEL
Lab: 139 MMOL/L (ref 137–147)
Lab: 4.1 MMOL/L (ref 3.5–5.1)
Lab: >60 mL/min (ref >60)
Lab: >60 mL/min (ref >60)
Lab: 12 (ref 3–12)

## 2019-02-27 LAB — LIPID PROFILE
Lab: 125 mg/dL — ABNORMAL HIGH (ref <100)
Lab: 142 mg/dL (ref 7–56)
Lab: 17 mg/dL (ref 21–30)
Lab: 211 mg/dL — ABNORMAL HIGH (ref <200)
Lab: 69 mg/dL (ref >40)
Lab: 83 mg/dL (ref <150)

## 2019-02-27 LAB — TSH WITH FREE T4 REFLEX: Lab: 1.6 uU/mL (ref 0.35–5.00)

## 2019-02-27 LAB — CBC
Lab: 10 K/UL (ref 4.5–11.0)
Lab: 31 pg (ref 26–34)
Lab: 4.8 M/UL (ref 4.0–5.0)
Lab: 45 % — ABNORMAL HIGH (ref 36–45)

## 2019-02-27 LAB — 25-OH VITAMIN D (D2 + D3): Lab: 34 ng/mL — ABNORMAL HIGH (ref 30–80)

## 2019-02-27 MED ORDER — AMLODIPINE 5 MG PO TAB
5 mg | ORAL_TABLET | Freq: Every day | ORAL | 3 refills | Status: AC
Start: 2019-02-27 — End: ?

## 2019-02-27 NOTE — Patient Instructions
Amlodipine tablets  Brand Name: Norvasc  What is this medicine?  AMLODIPINE (am LOE di peen) is a calcium-channel blocker. It affects the amount of calcium found in your heart and muscle cells. This relaxes your blood vessels, which can reduce the amount of work the heart has to do. This medicine is used to lower high blood pressure. It is also used to prevent chest pain.  How should I use this medicine?  Take this medicine by mouth with a glass of water. Follow the directions on the prescription label. You can take it with or without food. If it upsets your stomach, take it with food. Take your medicine at regular intervals. Do not take it more often than directed. Do not stop taking except on your doctor's advice.  Talk to your pediatrician regarding the use of this medicine in children. While this drug may be prescribed for children as young as 6 years for selected conditions, precautions do apply.  Patients over 65 years of age may have a stronger reaction and need a smaller dose.  What side effects may I notice from receiving this medicine?  Side effects that you should report to your doctor or health care professional as soon as possible:    · allergic reactions like skin rash, itching or hives; swelling of the face, lips, or tongue  · fast, irregular heartbeat  · signs and symptoms of low blood pressure like dizziness; feeling faint or lightheaded, falls; unusually weak or tired  · swelling of ankles, feet, hands  Side effects that usually do not require medical attention (report these to your doctor or health care professional if they continue or are bothersome):    · dry mouth  · facial flushing  · headache  · stomach pain  · tiredness  What may interact with this medicine?  Do not take this medicine with any of the following medications:  · tranylcypromine  This medicine may also interact with the following  medications:  · clarithromycin  · cyclosporine  · diltiazem  · itraconazole  · simvastatin  · tacrolimus  What if I miss a dose?  If you miss a dose, take it as soon as you can. If it is almost time for your next dose, take only that dose. Do not take double or extra doses.  Where should I keep my medicine?  Keep out of the reach of children.  Store at room temperature between 59 and 86 degrees F (15 and 30 degrees C).  Throw away any unused medicine after the expiration date.  What should I tell my health care provider before I take this medicine?  They need to know if you have any of these conditions:  · heart disease  · liver disease  · an unusual or allergic reaction to amlodipine, other medicines, foods, dyes, or preservatives  · pregnant or trying to get pregnant  · breast-feeding  What should I watch for while using this medicine?  Visit your healthcare professional for regular checks on your progress. Check your blood pressure as directed. Ask your healthcare professional what your blood pressure should be and when you should contact him or her.    Do not treat yourself for coughs, colds, or pain while you are using this medicine without asking your healthcare professional for advice. Some medicines may increase your blood pressure.    You may get dizzy. Do not drive, use machinery, or do anything that needs mental alertness until you know how this   medicine affects you. Do not stand or sit up quickly, especially if you are an older patient. This reduces the risk of dizzy or fainting spells. Avoid alcoholic drinks; they can make you dizzier.  NOTE:This sheet is a summary. It may not cover all possible information. If you have questions about this medicine, talk to your doctor, pharmacist, or health care provider. Copyright© 2020 [employer]

## 2019-02-27 NOTE — Progress Notes
Kimberly Phelps is a 68 y.o. female.    INTERVAL    She has followed with Dr. Curly Rim in the spine center for lumbar facet arthropathy.   She has received bilateral L5=S1 intrarticular injections with some improvement in her symptoms. She is going to have another one next week.   He thinks her pain in the hips feels better.     She has high cholesterol.   We had advised diet/exercise and recheck today.    Her blood pressure was elevated at a recent clinic visit and continues to be.  She has no chest discomfort or dyspnea or leg swelling.     CHRONIC  Anxiety  0.25mg  alprazolam tid as needed  Occasional [place] only.   Depression screen neg.    HRT:  Prior: estradiol(+) (VIVELLE-DOT) 0.0375 mg/24 hr patch; Apply 1 patch to top of skin as directed twice weekly.  progesterone, micronized(+) (PROMETRIUM) 100 mg capsule    Hypertension Management:    Current regimen: Lisinopril 20mg   CVD Hx: none  Smoker: No  Outside blood pressures being performed: Yes  BP Readings from Last 3 Encounters:   02/27/19 134/89   01/22/19 (!) 144/88   12/08/18 (!) 142/94   Imp: Hypertension not controlled   Lab Results   Component Value Date/Time    MCALBR <0.2 01/17/2012 10:23 AM       Hyperlipidemia Management  Current regimen: diet/exercise  CVD Hx: none  Lab Results   Component Value Date/Time    CHOL 236 (H) 03/11/2018 03:39 PM    TRIG 123 03/11/2018 03:39 PM    HDL 75 03/11/2018 03:39 PM    LDL 157 (H) 03/11/2018 03:39 PM    VLDL 25 03/11/2018 03:39 PM    NONHDLCHOL 161 03/11/2018 03:39 PM    CHOLHDLC 2.8 03/26/2016 09:09 AM    The 10-year ASCVD risk score Denman George DC Jr., et al., 2013) is: 8.7%    Values used to calculate the score:      Age: 37 years      Sex: Female      Is Non-Hispanic African American: No      Diabetic: No      Tobacco smoker: No      Systolic Blood Pressure: 134 mmHg      Is BP treated: Yes      HDL Cholesterol: 75 MG/DL      Total Cholesterol: 236 MG/DL    Esophagitis and GERD: Omeprazole 20mg  PO only as needed   EGD 12/27/2016 2 cm hiatal hernia, non bleeding erosions in the antrum, esophagitis. Neg H pylori.    Chronic Diarrhea - not active.  Jolee Ewing, MD  Panscreatic elastase, fecal electrolytes and fecal fat are pending.   Every since the colonoscopy and prep she has had no recurrence of the diarrhea. Total resolution.= in her symptoms.     Lumbar Radiculitis, Lumbar Facet Arthropathy   Salley Scarlet, MD, Voltaren Gel, duloxetine 60mg    MRI 08/22/2018 Lumbar Facet Arthropathy  S/p  s/p bilateral L5-S1 intraarticular 11/2018  > PT with injections    Hearing Loss  Lowry Bowl, MD ENT    Dense Breasts  Annual mammogram and ABUS    Health Maintenance  Breast Cancer Screening (q65yr): 02/2018 birads1 mammo + abus  Cervical Cancer Screening: (q5y w/ HPV, q3r):  2016  Colorectal Cancer:12/27/2016 q5 years  Bone Densiity: repeat due in 2019  BMI: Estimated body mass index is 29.05 kg/m? as calculated from the following:  Height as of 01/22/19: 160 cm (63).    Weight as of 01/22/19: 74.4 kg (164 lb).  Tobacco Screen (q2y):   Social History     Tobacco Use   Smoking Status Former Smoker   ? Packs/day: 1.00   ? Years: 20.00   ? Pack years: 20.00   ? Types: Cigarettes   ? Last attempt to quit: 11/03/1995   ? Years since quitting: 23.3   Smokeless Tobacco Never Used   Tobacco Comment    none x 12 years      Fall Risk Assessment: low risk  Post Menopaussal?  yes    Depression Screening:  Patient Scores:  PHQ-2: PHQ-2 Score: 0 (02/27/2019  9:38 AM)    PHQ-9: No data recorded  Interventions:  PHQ-2: PHQ-2 Score less than 3: No follow-up or recommendations are necessary at this time (02/27/2019  9:38 AM)    Depression Interventions PHQ-2/9: No data recorded    Social  She used to live in [place]. She has 2 sisters in the area.  Has step kids.   She worked in Education officer, environmental.  Works at [employer], [place] [employer]       Review of Systems   Constitutional: Negative for activity change, chills, fatigue and fever. HENT: Negative for sore throat.    Eyes: Negative for visual disturbance.   Respiratory: Negative for cough, chest tightness, shortness of breath and wheezing.    Cardiovascular: Negative for chest pain and leg swelling.   Gastrointestinal: Negative for abdominal pain and blood in stool.   Genitourinary: Negative for difficulty urinating and urgency.   Musculoskeletal: Negative for arthralgias and myalgias.   Skin: Negative for color change and rash.   Neurological: Negative for dizziness and light-headedness.   Hematological: Negative for adenopathy. Does not bruise/bleed easily.   Psychiatric/Behavioral: The patient is not nervous/anxious.          Objective:         ? acetaminophen (TYLENOL 8 HOUR PO) Take 1 tablet by mouth every 8 hours as needed.   ? ALPRAZolam (XANAX) 0.25 mg tablet Take one tablet by mouth three times daily as needed for Anxiety.   ? diclofenac (VOLTAREN) 1 % topical gel Apply four g topically to affected area three times daily.   ? duloxetine DR (CYMBALTA) 60 mg capsule TAKE 1 CAPSULE BY MOUTH EVERY DAY   ? lidocaine (LIDODERM TP) Apply  topically to affected area.   ? lisinopril (ZESTRIL) 20 mg tablet TAKE 1 TABLET BY MOUTH EVERY DAY   ? meloxicam (MOBIC) 7.5 mg tablet TAKE 1 TABLET BY MOUTH TWICE A DAY     Vitals:    02/27/19 0936   BP: 134/89   Pulse: 98   PainSc: Four     Vitals:    02/27/19 0936   BP: 134/89   Pulse: 98   PainSc: Four       There is no height or weight on file to calculate BMI.     Physical Exam  Vitals signs and nursing note reviewed.   Constitutional:       Appearance: Normal appearance. She is well-developed.   HENT:      Head: Normocephalic and atraumatic.   Eyes:      Conjunctiva/sclera: Conjunctivae normal.   Cardiovascular:      Rate and Rhythm: Normal rate.   Pulmonary:      Effort: Pulmonary effort is normal.   Abdominal:      General: Bowel sounds are  normal.   Musculoskeletal: Normal range of motion.   Skin:     General: Skin is warm and dry. Neurological:      Mental Status: She is alert.   Psychiatric:         Mood and Affect: Mood normal.         Behavior: Behavior normal.         Thought Content: Thought content normal.         Labwork reviewed:  Lab Results   Component Value Date/Time    MCALBR <0.2 01/17/2012 10:23 AM    TSH 3.63 03/11/2018 03:39 PM    TSH3G 0.55 03/26/2016 09:09 AM    FREET4R 1.1 11/03/2010 02:15 PM    CHOL 236 (H) 03/11/2018 03:39 PM    TRIG 123 03/11/2018 03:39 PM    HDL 75 03/11/2018 03:39 PM    LDL 157 (H) 03/11/2018 03:39 PM    NA 142 03/11/2018 03:39 PM    K 3.7 03/11/2018 03:39 PM    CL 107 03/11/2018 03:39 PM    CO2 26 03/11/2018 03:39 PM    GAP 9 03/11/2018 03:39 PM    BUN 20 03/11/2018 03:39 PM    CR 0.82 03/11/2018 03:39 PM    GLU 114 (H) 03/11/2018 03:39 PM    GLU 72 09/12/2016 03:24 PM    CA 9.8 03/11/2018 03:39 PM    ALBUMIN 4.5 03/11/2018 03:39 PM    TOTPROT 7.2 03/11/2018 03:39 PM    ALKPHOS 74 03/11/2018 03:39 PM    AST 21 03/11/2018 03:39 PM    ALT 16 03/11/2018 03:39 PM    TOTBILI 0.3 03/11/2018 03:39 PM    GFR >60 03/11/2018 03:39 PM    GFRAA >60 03/11/2018 03:39 PM            Assessment and Plan:  Kimberly Phelps was seen today     Diagnoses and all orders for this visit:     Preventive Care Visit  Her BMI is addressed through diet and exercise. She is not depressed  > dense breasts: abus + mammogram  > lipids, cbc, cmp, tsh, vitamin D  > PAP next year, colonoscopy in 3 years  > flu shot    Hyperlipidemia  Intermediate ASCVD score discussed at our last visit.  Discussed importance of aerobic exercise and Mediterrenean diet.  We discussed tools for success.   > recheck lipids today    HTN (hypertension), benign:  Persistently not controlled.   > continue lisinopril 20  > start amlodipine 5mg   > monitor at local pharmacy, call if > 140/90     Lumbar facet arthropathy: Chronic. Planning for another facet injection next week.  > continue duloxetine  > continue w/ Dr. Katrinka Blazing  > continue PT    Anxiety Chronic and stable despite covid-19  > refill alprazolam prn                  Problem   Lumbar Facet Arthropathy   Dense Breasts   Lumbar Facet Arthropathy   Htn (Hypertension), Benign   Anxiety   Hormone Replacement Therapy (Resolved)   Bilateral Hearing Loss (Resolved)   Lumbar Facet Arthropathy (Resolved)   Adnexal Cyst (Resolved)    11/2016:  3. ?Small left adnexal cystic lesion, likely benign. Consider follow-up pelvic ultrasound in 12 months to assess for stability.     Chronic Diarrhea (Resolved)    Added automatically from request for surgery [phone]  There are no Patient Instructions on file for this visit.    No follow-ups on file.

## 2019-03-06 ENCOUNTER — Encounter: Admit: 2019-03-06 | Discharge: 2019-03-06 | Payer: MEDICARE

## 2019-03-09 ENCOUNTER — Encounter: Admit: 2019-03-09 | Discharge: 2019-03-09 | Payer: MEDICARE

## 2019-03-09 ENCOUNTER — Ambulatory Visit: Admit: 2019-03-09 | Discharge: 2019-03-09 | Payer: MEDICARE

## 2019-03-09 DIAGNOSIS — M47816 Spondylosis without myelopathy or radiculopathy, lumbar region: Secondary | ICD-10-CM

## 2019-03-09 DIAGNOSIS — F419 Anxiety disorder, unspecified: Secondary | ICD-10-CM

## 2019-03-09 DIAGNOSIS — M545 Low back pain: Secondary | ICD-10-CM

## 2019-03-09 DIAGNOSIS — M1710 Unilateral primary osteoarthritis, unspecified knee: Secondary | ICD-10-CM

## 2019-03-09 DIAGNOSIS — I10 Essential (primary) hypertension: Secondary | ICD-10-CM

## 2019-03-09 MED ORDER — IOHEXOL 300 MG IODINE/ML IV SOLN
2 mL | Freq: Once | 0 refills | Status: CP
Start: 2019-03-09 — End: ?

## 2019-03-09 MED ORDER — TRIAMCINOLONE ACETONIDE 40 MG/ML IJ SUSP
80 mg | Freq: Once | EPIDURAL | 0 refills | Status: CP
Start: 2019-03-09 — End: ?

## 2019-03-09 MED ORDER — LIDOCAINE (PF) 10 MG/ML (1 %) IJ SOLN
2 mL | Freq: Once | INTRAMUSCULAR | 0 refills | Status: CP
Start: 2019-03-09 — End: ?

## 2019-03-09 NOTE — Procedures
Attending Surgeon: Curly Rim, MD    Anesthesia: Local    Pre-Procedure Diagnosis:   1. Lumbar facet arthropathy        Post-Procedure Diagnosis:   1. Lumbar facet arthropathy        [facility] [facility] INJECT PVRT FACET MBB JT LMBR/SAC  Procedure: paravertebral facet    Laterality: bilateral    Location: lumbar - L5-S1      Consent:   Consent obtained: verbal and written  Consent given by: patient  Risks discussed: bleeding, bruising, infection, nerve damage, no change or worsening in pain, reaction to medication and weakness    Discussed with patient the purpose of the treatment/procedure, other ways of treating my condition, including no treatment/ procedure and the risks and benefits of the alternatives. Patient has decided to proceed with treatment/procedure.        Universal Protocol:  Relevant documents: relevant documents present and verified  Imaging studies: imaging studies available  Site marked: the operative site was marked  Patient identity confirmed: Patient identify confirmed verbally with patient.        Time out: Immediately prior to procedure a time out was called to verify the correct patient, procedure, equipment, support staff and site/side marked as required      Procedures Details:   Indications: pain and diagnostic evaluation   Prep: chlorhexidine  Patient position: prone  Estimated Blood Loss: minimal  Specimens: none  Number of Levels: 1  Guidance: fluoroscopy  Needle and Epidural Catheter: quincke  Needle size: 25 G  Injection procedure: Negative aspiration for blood  Patient tolerance: Patient tolerated the procedure well with no immediate complications. Pressure was applied, and hemostasis was accomplished.  Comments: DESCRIPTION OF PROCEDURE:  The procedure risks and benefits were explained and informed consent was obtained from the patient.  The patient was placed in prone position on fluoroscopy table.  Blood pressure cuff and oxygen saturation monitors were attached and the patient was monitored throughout the procedure. The skin was prepped using Chlorhexadine and draped in aseptic fashion.  The C-arm was rotated obliquely towards the right side to visualize the right L5-S1 facet joint.  The skin and subcutaneous tissue was anesthetized using 2 mL of 1 percent lidocaine with 27-gauge needle.  A 3.5 inch 25-gauge spinal needle was advanced parallel to the x-ray beam towards the right L5-S1 facet joint.  The tip of the needle made contact with the bone and was slowly walked off and inserted into the facet joint.  After negative aspiration, 0.2 mL of Isovue contrast dye was injected and demonstrated articular flow.  Subsequently, a solution containing. 0.5 mL of 1 percent lidocaine and 20 mg of triamcinolone was injected into the right L5-S1 facet.  The stylet was reinserted and the needle was then removed.     Subsequently, attention was taken to the left side, where the left L5-S1 facet joint were visualized with fluoroscopy in the oblique view.  The skin and subcutaneous tissue was anesthetized using 2 mL of 1 percent lidocaine with 27-gauge needle.  A 3.5 inch 25-gauge spinal needle was advanced parallel to the x-ray beam towards the left L5-S1 facet joint.  The needles were advanced slowly until the tip of the needle made contact with the bone and was subsequently walked off and advanced into the facet joint.   After negative aspiration, 0.2 mL of Isovue contrast dye was injected and arthrogram was seen.  A mixture containing 0.5 mL of 1 percent lidocaine and 20 mg of  triamcinolone was injected into the left L5-S1 facet joint.  The stylet was reinserted and needle was then removed.  There is no sensory or motor deficit of extremity noted.  After the procedure, the patient's blood pressure, heart rate, and oxygen saturation were recorded in the chart.     There were no complications.  The patient tolerated the procedure well and was brought to the recovery room for observation in stable condition and discharged with written discharge instructions.     PLAN OF CARE:  The patient is to followup in 6 weeks.    The patient was advised to contact the [facility] for any of the following    Fever, chills, or night sweats.  New onset of severe, sharp pain.  Any new upper or lower extremity weakness or numbness.  Any questions regarding the procedure.    If unable to contact the [facility], the patient instructed to go to the local emergency room.           Estimated blood loss: none or minimal  Specimens: none  Patient tolerated the procedure well with no immediate complications. Pressure was applied, and hemostasis was accomplished.

## 2019-03-09 NOTE — Discharge Instructions - Supplementary Instructions
GENERAL POST PROCEDURE INSTRUCTIONS  Physician: _________________________________  Procedure Completed Today:  o Joint Injection (hip, knee, shoulder)  o Cervical Epidural Steroid Injection  o Cervical Transforaminal Steroid Injection  o Trigger Point Injection  o Caudal Epidural Steroid Injection  o Pudendal Nerve Block  o Other _____________________ o Thoracic Epidural Steroid Injection  o Lumbar Epidural Steroid Injection  o Lumbar Transforaminal Steroid Injection  o Facet Joint Injection  o Celiac Nerve Block  o Sacrococcygeal  o Sacroiliac Joint Injection   Important information following your procedure today:  o You may drive today     o If you had sedation, you may NOT drive today  ? Rest at home for the next 6 hours.  You may then begin to resume your normal activities.  ? DO NOT drive any vehicle, operate any power tools, drink alcohol, make any major decisions, or sign any legal documents for the next 12 hours.  1. Pain relief may not be immediate. It is possible you may even experience an increase in pain during the first 24-48 hours followed by a gradual decrease of your pain.  2. Though the procedure is generally safe and complications are rare, we do ask that you be aware of any of the following:  ? Any swelling, persistent redness, new bleeding or drainage from the site of the injection.  ? You should not experience a severe headache.  ? You should not run a fever over 101oF.  ? New onset of sharp, severe back and or neck pain.  ? New onset of upper or lower extremity numbness or weakness.  ? New difficulty controlling bowel or bladder function after injection.  ? New shortness of breath.  If any of these occur, please call to report this occurrence to a nurse at [phone]. If you are calling after 4:00pm, on the weekends or holidays please call [phone] and ask to have the pain management resident physician on call for the physician paged or go to your local emergency room. 3. You may experience soreness at the injection site. Ice can be applied at 20 minute intervals for the first 24 hours. The following day you may alternate ice with heat if you are experiencing muscle tightness, otherwise continue with ice. Ice works best at decreasing pain. Avoid application of direct heat, hot showers or hot tubs today.  4. Avoid strenuous activity today. You many resume your regular activities and exercise tomorrow.  5. Patients with diabetes may see an elevation in blood sugars for 7-10 days after the injection. It is important to pay close attention to your diet, check your blood sugars daily and repost extreme elevations to the physician that treats your diabetes.  6. Patients taking daily blood thinners can resume their regular dose this evening.  7. It is important that you take all medications ordered by your pain physician. Taking medications as ordered is an important part of you pain care plan. If you cannot continue the medication plan, please notify the physician.    Possible side effects to steroids that may occur:  ? Flushing or redness of the face  ? Irritability  ? Fluid retention  ? Change in women's menses    Follow up appointment as needed if in the event you are unable to keep an appointment please notify the scheduler 24 hours in advance at [phone]. If you have questions for the surgery center, call [facility] at [phone].

## 2019-03-09 NOTE — Progress Notes
SPINE CENTER  INTERVENTIONAL PAIN PROCEDURE HISTORY AND PHYSICAL    Chief Complaint   Patient presents with   ? Procedure       HISTORY OF PRESENT ILLNESS:  Kimberly Phelps is a 68 y.o. year old female who presents for injection.  Denies fevers, chills, or recent hospitalizations.  No allergies to contrast, latex, seafood, iodine, or shellfish.  Patient denies blood thinning medications.        Medical History:   Diagnosis Date   ? Anxiety    ? Chronic lower back pain    ? HTN (hypertension)    ? Lumbar facet arthropathy    ? Patellofemoral arthritis        Surgical History:   Procedure Laterality Date   ? BARTHOLIN GLAND CYST EXCISION  1978   ? ELBOW SURGERY Right 1989    scar tissue removal for tendonitis   ? HYDROGEN BREATH TEST N/A 12/12/2016    Performed by Jolee Ewing[facility] at [facility]   ? COLONOSCOPY N/A 12/27/2016    Performed by Jolee Ewing, MD at [facility] OR   ? ESOPHAGOGASTRODUODENOSCOPY w/ bxs N/A 12/27/2016    Performed by Jolee Ewing, MD at [facility] OR       family history includes Basal Cell Carcinoma in her father; Cancer in her father; Essie Christine (age of onset: 56) in her mother; Depression in her sister; Hypertension in her father and mother; Migraines in her paternal grandmother and sister; Other in her father and mother; Seizures in her paternal uncle; Thyroid Disease in her sister.    Social History     Socioeconomic History   ? Marital status: Married     Spouse name: Not on file   ? Number of children: 0   ? Years of education: Not on file   ? Highest education level: Not on file   Occupational History   ? Occupation: Chiropractor: [employer]   Tobacco Use   ? Smoking status: Former Smoker     Packs/day: 1.00     Years: 20.00     Pack years: 20.00     Types: Cigarettes     Last attempt to quit: 11/03/1995     Years since quitting: 23.3   ? Smokeless tobacco: Never Used   ? Tobacco comment: none x 12 years   Substance and Sexual Activity   ? Alcohol use: Yes Alcohol/week: 14.0 standard drinks     Types: 8 Glasses of wine, 6 Standard drinks or equivalent per week   ? Drug use: No   ? Sexual activity: Not on file   Other Topics Concern   ? Military Service Not Asked   ? Blood Transfusions Not Asked   ? Caffeine Concern Yes     Comment: 1-2 cups per day   ? Occupational Exposure Not Asked   ? Hobby Hazards Not Asked   ? Sleep Concern Not Asked   ? Stress Concern Not Asked   ? Weight Concern Not Asked   ? Special Diet Not Asked   ? Back Care Not Asked   ? Exercise Yes     Comment: 30- 40 minutes per day   ? Bike Helmet Not Asked   ? Seat Belt Not Asked   ? Self-Exams Not Asked   Social History Narrative   ? Not on file       Allergies   Allergen Reactions   ? Erythromycin NAUSEA AND VOMITING  Vitals:    03/09/19 1220   BP: (!) 147/97   BP Source: Arm, Right Lower   Temp: 36.6 ?C (97.9 ?F)   SpO2: 96%   Weight: 74 kg (163 lb 3.2 oz)   Height: 160 cm (63)       REVIEW OF SYSTEMS: 10 point ROS obtained and negative except for back pain      PHYSICAL EXAM:  General: 68 y.o. female appears stated age, in no acute distress  HEENT: Normocephalic, atraumatic  Neck: No thyroidmegaly  Cardiovascular: Well perfused  Pulmonary: Unlabored respirations  Extremities: No cyanosis, clubbing, or edema  Skin: No lesions seen on exposed skin  Psychiatric:  Appropriate mood and affect  Musculoskeletal: No atrophy.   Neurologic: Antigravity strength in all extremities. CN II -XII grossly intact.  Alert and oriented x 3.         IMPRESSION:    1. Lumbar facet arthropathy         PLAN:   Bilateral L5-S1 facet injection

## 2019-04-06 ENCOUNTER — Encounter: Admit: 2019-04-06 | Discharge: 2019-04-06 | Payer: MEDICARE

## 2019-04-06 ENCOUNTER — Ambulatory Visit: Admit: 2019-04-06 | Discharge: 2019-04-06 | Payer: MEDICARE

## 2019-04-06 DIAGNOSIS — R922 Inconclusive mammogram: Secondary | ICD-10-CM

## 2019-04-06 DIAGNOSIS — Z1231 Encounter for screening mammogram for malignant neoplasm of breast: Secondary | ICD-10-CM

## 2019-04-06 DIAGNOSIS — Z0000 Encounter for general adult medical examination without abnormal findings: Secondary | ICD-10-CM

## 2019-04-16 ENCOUNTER — Encounter: Admit: 2019-04-16 | Discharge: 2019-04-16 | Payer: MEDICARE

## 2019-04-16 DIAGNOSIS — I10 Essential (primary) hypertension: Secondary | ICD-10-CM

## 2019-04-16 MED ORDER — LISINOPRIL 20 MG PO TAB
20 mg | ORAL_TABLET | Freq: Every day | ORAL | 1 refills | Status: DC
Start: 2019-04-16 — End: 2019-10-05

## 2019-04-16 NOTE — Telephone Encounter
Pharmacy requesting:lisinopril  LR 06/23/2018  LOV: 02/27/2019  Per IM general medicine protocol, medication was signed and sent to pharmacy.   Myrtie Hawk, RN

## 2019-04-20 ENCOUNTER — Encounter: Admit: 2019-04-20 | Discharge: 2019-04-20 | Payer: MEDICARE

## 2019-04-20 NOTE — Telephone Encounter
Patient called in regards to amlodipine and lisinopril. She stated that she takes these at the same time and has noticed that when she takes these together, she feels fatigued and feels like her heart rate speeds up. Patient does not have BP cuff so does not know what her BP or heart rate is at the time but will look into getting a machine and is wanting to know about these two medications and if she should take them separately and if so, which one can she take in the morning and which one in the afternoon/evening. Routing to Dr. Patsey Berthold. Clayton Lefort, RN

## 2019-04-20 NOTE — Telephone Encounter
Called patient and discussed information listed below. Patient agreed and verbalized understanding. Shiva Karis, RN

## 2019-04-20 NOTE — Telephone Encounter
Cecilio Asper, MD  Clayton Lefort, RN   Caller: Unspecified (Today, 2:06 PM)            She can take amlodipine at night. Probably should purchase a BP cuff and monitor

## 2019-05-28 ENCOUNTER — Encounter: Admit: 2019-05-28 | Discharge: 2019-05-28 | Payer: MEDICARE

## 2019-05-28 MED ORDER — DULOXETINE 60 MG PO CPDR
ORAL_CAPSULE | Freq: Every day | ORAL | 1 refills | 60.00000 days | Status: DC
Start: 2019-05-28 — End: 2019-11-25

## 2019-05-28 NOTE — Telephone Encounter
Pharmacy faxed refill request on duloxetine. It is a Standing order and the patient meets the Standing order requirements. I faxed the request back to the pharmacy. Abigayl Hor, RN

## 2019-09-30 MED ORDER — ALPRAZOLAM 0.25 MG PO TAB
ORAL_TABLET | Freq: Three times a day (TID) | 5 refills | Status: AC | PRN
Start: 2019-09-30 — End: ?

## 2019-10-05 ENCOUNTER — Encounter: Admit: 2019-10-05 | Discharge: 2019-10-05 | Payer: MEDICARE

## 2019-10-05 DIAGNOSIS — I10 Essential (primary) hypertension: Secondary | ICD-10-CM

## 2019-10-05 MED ORDER — LISINOPRIL 20 MG PO TAB
ORAL_TABLET | Freq: Every day | 1 refills | Status: AC
Start: 2019-10-05 — End: ?

## 2019-10-05 NOTE — Telephone Encounter
Pharmacy faxed refill request on lisinopril. It is a Standing order and the patient meets the Standing order requirements. I faxed the request back to the pharmacy. Tylar Amborn, RN

## 2019-11-25 ENCOUNTER — Encounter: Admit: 2019-11-25 | Discharge: 2019-11-25 | Payer: MEDICARE

## 2019-11-25 MED ORDER — DULOXETINE 60 MG PO CPDR
60 mg | ORAL_CAPSULE | Freq: Every day | ORAL | 1 refills | 60.00000 days | Status: AC
Start: 2019-11-25 — End: ?

## 2020-02-17 ENCOUNTER — Encounter: Admit: 2020-02-17 | Discharge: 2020-02-17 | Payer: MEDICARE

## 2020-02-17 DIAGNOSIS — I10 Essential (primary) hypertension: Secondary | ICD-10-CM

## 2020-02-17 MED ORDER — AMLODIPINE 5 MG PO TAB
ORAL_TABLET | Freq: Every day | 1 refills | Status: AC
Start: 2020-02-17 — End: ?

## 2020-02-17 NOTE — Telephone Encounter
Refill Request received from patient's [facility].    Patient last seen 02/27/19 with plan to continue Amlodipine 5mg  tablet daily.    Follow up appt scheduled for 03/09/20.    3 months and 1 refills e-scribed per protocol.    Shelly Bombard, RN

## 2020-02-24 ENCOUNTER — Encounter: Admit: 2020-02-24 | Discharge: 2020-02-24 | Payer: MEDICARE

## 2020-02-24 ENCOUNTER — Ambulatory Visit: Admit: 2020-02-24 | Discharge: 2020-02-24 | Payer: MEDICARE

## 2020-02-24 DIAGNOSIS — F419 Anxiety disorder, unspecified: Secondary | ICD-10-CM

## 2020-02-24 DIAGNOSIS — I10 Essential (primary) hypertension: Secondary | ICD-10-CM

## 2020-02-24 DIAGNOSIS — M545 Chronic bilateral low back pain without sciatica: Secondary | ICD-10-CM

## 2020-02-24 DIAGNOSIS — M47816 Spondylosis without myelopathy or radiculopathy, lumbar region: Secondary | ICD-10-CM

## 2020-02-24 DIAGNOSIS — M1710 Unilateral primary osteoarthritis, unspecified knee: Secondary | ICD-10-CM

## 2020-02-24 NOTE — Progress Notes
[facility] CLINIC NOTE       SUBJECTIVE:   Kimberly Phelps is a 69 y.o.-year-old female with history of hypertension, anxiety, and arthritis, who presents for follow-up after bilateral L5-S1 facet injection on 03/09/2019. Patient reports significant relief with the injections. Patient reports at least  80% reduction in pain for 10 months.  Previously had L5-S1 intra-articular facet injections on 2 occasions with greater than 80% relief for greater than 6 months.  Overall, she is happy with her level of pain relief. She is interested in having this repeated. Pain has since recurred.  She has pain bilaterally in the low back.  She denies any radiation down into the legs.  She denies any numbness or tingling.  Pain is at preprocedure level. VAS pain score is rated a 6/10. She has been continuing with home exercise program. She has not changed her medications. She able to complete all normal daily activities and feels as though her pain is manageable after injections. Denies balance difficulties or overt weakness in extremities. Denies recent falls. Denies any loss of control of bowel or bladder.        Review of Systems    Current Outpatient Medications:   ?  acetaminophen (TYLENOL 8 HOUR PO), Take 1 tablet by mouth every 8 hours as needed., Disp: , Rfl:   ?  ALPRAZolam (XANAX) 0.25 mg tablet, TAKE 1 TABLET BY MOUTH 3 TIMES A DAY AS NEEDED FOR ANXIETY, Disp: 30 tablet, Rfl: 5  ?  amLODIPine (NORVASC) 5 mg tablet, TAKE 1 TABLET BY MOUTH EVERY DAY, Disp: 90 tablet, Rfl: 1  ?  diclofenac (VOLTAREN) 1 % topical gel, Apply four g topically to affected area three times daily., Disp: 300 g, Rfl: 3  ?  duloxetine DR (CYMBALTA) 60 mg capsule, Take one capsule by mouth daily., Disp: 90 capsule, Rfl: 1  ?  lidocaine (LIDODERM TP), Apply  topically to affected area., Disp: , Rfl:   ?  lisinopriL (ZESTRIL) 20 mg tablet, TAKE 1 TABLET BY MOUTH EVERY DAY, Disp: 90 tablet, Rfl: 1  ?  meloxicam (MOBIC) 7.5 mg tablet, TAKE 1 TABLET BY MOUTH TWICE A DAY, Disp: 60 tablet, Rfl: 0  Allergies   Allergen Reactions   ? Erythromycin NAUSEA AND VOMITING     Physical Exam  Vitals:    02/24/20 1117   BP: 121/83   BP Source: Arm, Right Upper   Patient Position: Sitting   Pulse: 87   Resp: 20   Temp: 36.7 ?C (98.1 ?F)   TempSrc: Oral   SpO2: 99%   Weight: 72.8 kg (160 lb 9.6 oz)   Height: 160 cm (63)   PainSc: Six        Pain Score: Six  Body mass index is 28.45 kg/m?Marland Kitchen  General: 69 y.o. female appears stated age, in no acute distress  HEENT: Normocephalic, atraumatic  Neck: No thyroidmegaly  Cardiovascular: Well perfused  Pulmonary: Unlabored respirations  Extremities: No cyanosis, clubbing, or edema  Skin: Warm and dry  Psychiatric:  Appropriate mood and affect  Musculoskeletal: Full range of motion with lumbar flexion, decreased with extension, and lateral rotation.  Tender to palpation at L5-S1 facets bilaterally, bilateral PSIS, bilateral gluteus medius, and bilateral greater trochanter. Facet loading is negative bilaterally.  FABER is positive bilaterally.   FAIR is negative bilaterally.   Neurologic: Lower extremity myotomes are all 5/5.  Lower extremity dermatomes are all intact to light touch.  Deep tendon reflexes are symmetric at patella and  achilles.  Negative slump test bilaterally. Downward Babinski.  No ankle clonus.        IMPRESSION:  1. Lumbar facet arthropathy    2. Chronic bilateral low back pain without sciatica        PLAN:    1.  Lifestyle modifications.  Recommend activity as tolerated.  Avoid provocative maneuvers.  Keep spine in neutral position.  2.  Medications.  No changes indicated at this time.  Continue with medications as previously prescribed.  3.  Therapy.  Patient provided prescription for aquatic physical therapy to initiate 1 to 2 weeks after injection.  She may continue with provider directed home exercises which she has been compliant with over the last year.  4.  Imaging.  None indicated at this time.  5. Interventions.  Recommend repeating bilateral L5-S1 intra-articular facet injections.  Risks of the procedure were discussed including pain, bleeding, infection, and damage to nearby structures.  Patient has elected to proceed with injection at this time.  Discussed with patient if injections continue to last for greater than 6 months would recommend repeating facet injections.  If injections last less than 6 months could consider medial branch blocks with progression to RFA.  6.  Follow-up.  Patient to follow-up for injection.

## 2020-03-09 ENCOUNTER — Ambulatory Visit: Admit: 2020-03-09 | Discharge: 2020-03-09 | Payer: MEDICARE

## 2020-03-09 ENCOUNTER — Encounter: Admit: 2020-03-09 | Discharge: 2020-03-09 | Payer: MEDICARE

## 2020-03-09 DIAGNOSIS — M47816 Spondylosis without myelopathy or radiculopathy, lumbar region: Secondary | ICD-10-CM

## 2020-03-09 DIAGNOSIS — I10 Essential (primary) hypertension: Secondary | ICD-10-CM

## 2020-03-09 DIAGNOSIS — E559 Vitamin D deficiency, unspecified: Secondary | ICD-10-CM

## 2020-03-09 DIAGNOSIS — Z23 Encounter for immunization: Principal | ICD-10-CM

## 2020-03-09 DIAGNOSIS — F419 Anxiety disorder, unspecified: Secondary | ICD-10-CM

## 2020-03-09 DIAGNOSIS — Z1231 Encounter for screening mammogram for malignant neoplasm of breast: Secondary | ICD-10-CM

## 2020-03-09 DIAGNOSIS — Z0000 Encounter for general adult medical examination without abnormal findings: Secondary | ICD-10-CM

## 2020-03-09 DIAGNOSIS — M545 Chronic lower back pain: Secondary | ICD-10-CM

## 2020-03-09 DIAGNOSIS — R922 Inconclusive mammogram: Secondary | ICD-10-CM

## 2020-03-09 DIAGNOSIS — M1710 Unilateral primary osteoarthritis, unspecified knee: Secondary | ICD-10-CM

## 2020-03-09 DIAGNOSIS — R238 Other skin changes: Secondary | ICD-10-CM

## 2020-03-09 LAB — COMPREHENSIVE METABOLIC PANEL
Lab: 141 MMOL/L (ref 137–147)
Lab: 15 U/L (ref 7–56)
Lab: 28 MMOL/L (ref 21–30)
Lab: 4.9 MMOL/L (ref 3.5–5.1)
Lab: >60 mL/min (ref >60)
Lab: >60 mL/min (ref >60)
Lab: 10 (ref 3–12)

## 2020-03-09 LAB — CBC
Lab: 13 % (ref 11–15)
Lab: 15 g/dL — ABNORMAL HIGH (ref 12.0–15.0)
Lab: 32 pg (ref 26–34)
Lab: 328 K/UL (ref 150–400)
Lab: 34 g/dL (ref 32.0–36.0)
Lab: 4.7 M/UL (ref 4.0–5.0)
Lab: 44 % (ref 36–45)
Lab: 7.3 FL (ref 7–11)
Lab: 9.1 K/UL (ref 4.5–11.0)
Lab: 94 FL (ref 80–100)

## 2020-03-09 LAB — 25-OH VITAMIN D (D2 + D3): Lab: 43 ng/mL (ref 30–80)

## 2020-03-09 LAB — LIPID PROFILE
Lab: 164 mg/dL — ABNORMAL HIGH (ref <100)
Lab: 244 mg/dL — ABNORMAL HIGH (ref <200)
Lab: 80 mg/dL (ref <150)

## 2020-03-09 LAB — TSH WITH FREE T4 REFLEX: Lab: 1.8 uU/mL (ref >40)

## 2020-03-09 MED ORDER — OMEPRAZOLE 20 MG PO CPDR
20 mg | ORAL_CAPSULE | Freq: Every day | ORAL | 0 refills | Status: AC
Start: 2020-03-09 — End: ?

## 2020-03-09 NOTE — Patient Instructions
Lifestyle Changes for Controlling?GERD  When you have GERD, stomach acid feels as if it?s backing up toward your mouth. Making lifestyle changes can often improve your symptoms. This is true if you take medicine to control your GERD or not. Talk with your healthcare provider about the following suggestions. They may help you get relief from your symptoms.  Raise your head    Reflux is more likely to happen when you?re lying down flat. That's because stomach fluid can flow backward more easily. Raising the head of your bed?4 to 6 inches can help. To do this:  ? Slide blocks or books under the legs at the head of your bed. Or put a wedge under the mattress. Many foam stores can make a wedge for you. The wedge should go from your waist to the top of your head.  ? Don?t just prop your head up on a few pillows. This increases pressure on your stomach. It can make GERD worse.  Watch your eating habits  Certain foods may increase the acid in your stomach. Or they may relax the lower esophageal sphincter. This makes GERD more likely. It?s best to avoid the following if they cause you symptoms:  ? Coffee, tea, and carbonated drinks (with and without caffeine)  ? Fatty, fried, or spicy food  ? Mint, chocolate, onions, tomatoes, and citrus  ? Peppermint  ? Any other foods that seem to irritate your stomach or cause you pain  Relieve the pressure  Tips include the following:  ? Eat smaller meals, even if you have to eat more often.  ? Don?t lie down right after you eat. Wait a few hours for your stomach to empty.  ? Don't wear tight belts or tight-fitting clothes.  ? Lose any extra weight.  Tobacco and alcohol  Don't smoke tobacco or drink alcohol. They can make GERD symptoms worse.  [employer] last reviewed this educational content on 10/12/2017  ? 2000-2021 [employer], [place]. All rights reserved. This information is not intended as a substitute for professional medical care. Always follow your healthcare professional's instructions.        Omeprazole Delayed Release Oral Capsule 10 mg  Uses  This medicine is used for the following purposes:  ? indigestion  ? inflammation of stomach  ? inflammation of the esophagus  ? stomach acid  ? stomach acid reflux  ? ulcers in stomach or intestines  ? ulcers in stomach or intestines  Instructions  Swallow the medicine without crushing or chewing it.  Take the medicine before eating.  It is very important that you take the medicine at about the same time every day. It will work best if you do this.  Keep the medicine at room temperature. Avoid heat and direct light.  It is important that you keep taking each dose of this medicine on time even if you are feeling well.  If you forget to take a dose on time, take it as soon as you remember. If it is almost time for the next dose, do not take the missed dose. Return to your normal dosing schedule. Do not take 2 doses of this medicine at one time.  Please tell your doctor and pharmacist about all the medicines you take. Include both prescription and over-the-counter medicines. Also tell them about any vitamins, herbal medicines, or anything else you take for your health.  If your symptoms do not improve or they worsen while on this medicine, contact your doctor.  Do not  suddenly stop taking this medicine. Check with your doctor before stopping.  Cautions  Tell your doctor and pharmacist if you ever had an allergic reaction to a medicine. Symptoms of an allergic reaction can include trouble breathing, skin rash, itching, swelling, or severe dizziness.  Do not use the medication any more than instructed.  Contact your doctor if you notice a change in the amount or darkening of your urine.  Tell the doctor or pharmacist if you are pregnant, planning to be pregnant, or breastfeeding.  Ask your pharmacist if this medicine can interact with any of your other medicines. Be sure to tell them about all the medicines you take.  Do not start or stop any other medicines without first speaking to your doctor or pharmacist.  Do not share this medicine with anyone who has not been prescribed this medicine.  This medicine can cause serious side effects in some patients. Important information from the [place] Food and Drug Administration (FDA) is available from your pharmacist. Please review it carefully with your pharmacist to understand the risks associated with this medicine.  Side Effects  The following is a list of some common side effects from this medicine. Please speak with your doctor about what you should do if you experience these or other side effects.  ? headaches  ? stomach upset or abdominal pain  Call your doctor or get medical help right away if you notice any of these more serious side effects:  ? diarrhea  ? pain in the joints  ? butterfly-shaped rash on nose and cheeks  A few people may have an allergic reaction to this medicine. Symptoms can include difficulty breathing, skin rash, itching, swelling, or severe dizziness. If you notice any of these symptoms, seek medical help quickly.  Extra  Please speak with your doctor, nurse, or pharmacist if you have any questions about this medicine.  [email or web address]  IMPORTANT NOTE: This document tells you briefly how to take your medicine, but it does not tell you all there is to know about it.Your doctor or pharmacist may give you other documents about your medicine. Please talk to them if you have any questions.Always follow their advice. There is a more complete description of this medicine available in [place].Scan this code on your smartphone or tablet or use the web address below. You can also ask your pharmacist for a printout. If you have any questions, please ask your pharmacist.   ? 2021 [employer]

## 2020-03-09 NOTE — Progress Notes
Kimberly Phelps is a 69 y.o. female.    INTERVAL    She has followed with Dr. Curly Rim in the spine center for lumbar facet arthropathy.   She has received bilateral L5=S1 intrarticular injections with some improvement in her symptoms. She is going to have another one next week.   He thinks her pain in the hips feels better.     She is doing well.  Starting exercise.     Dog just died.     New job, loves it.     Some easy bruising. Not taking NSAIDS or meloxicam.    She is having gerd sx.  Worse at night.   Eats late dinners, < 3 hrs before reclining.   Has wine / pizza type combinations. No exertional dyspnea, or anginal type sx.     CHRONIC  Anxiety  0.25mg  alprazolam tid as needed  Occasional [place] only.   Depression screen neg.    HRT:  Prior: estradiol(+) (VIVELLE-DOT) 0.0375 mg/24 hr patch; Apply 1 patch to top of skin as directed twice weekly.  progesterone, micronized(+) (PROMETRIUM) 100 mg capsule    Hypertension Management:    Current regimen: Lisinopril 20mg , amlodipine 5mg  daily  CVD Hx: none  Smoker: No  Outside blood pressures being performed: Yes  BP Readings from Last 3 Encounters:   03/09/20 130/81   02/24/20 121/83   03/09/19 (!) 126/94   Imp: Hypertension not controlled   Lab Results   Component Value Date/Time    MCALBR <0.2 01/17/2012 10:23 AM       Hyperlipidemia Management  Current regimen: diet/exercise  CVD Hx: none  Lab Results   Component Value Date/Time    CHOL 211 (H) 02/27/2019 10:11 AM    TRIG 83 02/27/2019 10:11 AM    HDL 69 02/27/2019 10:11 AM    LDL 125 (H) 02/27/2019 10:11 AM    VLDL 17 02/27/2019 10:11 AM    NONHDLCHOL 142 02/27/2019 10:11 AM    CHOLHDLC 2.8 03/26/2016 09:09 AM    The 10-year ASCVD risk score Denman George DC Jr., et al., 2013) is: 9%    Values used to calculate the score:      Age: 77 years      Sex: Female      Is Non-Hispanic African American: No      Diabetic: No      Tobacco smoker: No      Systolic Blood Pressure: 130 mmHg      Is BP treated: Yes      HDL Cholesterol: 69 MG/DL      Total Cholesterol: 211 MG/DL    Esophagitis and GERD:  Omeprazole 20mg  PO only as needed   EGD 12/27/2016 2 cm hiatal hernia, non bleeding erosions in the antrum, esophagitis. Neg H pylori.    Chronic Diarrhea - not active.  Jolee Ewing, MD  Panscreatic elastase, fecal electrolytes and fecal fat are pending.   Every since the colonoscopy and prep she has had no recurrence of the diarrhea. Total resolution.= in her symptoms.     Lumbar Radiculitis, Lumbar Facet Arthropathy   Salley Scarlet, MD, Voltaren Gel, duloxetine 60mg    MRI 08/22/2018 Lumbar Facet Arthropathy  S/p  s/p bilateral L5-S1 intraarticular 11/2018  > PT with injections    Hearing Loss  Lowry Bowl, MD ENT    Dense Breasts  Annual mammogram and ABUS    Health Maintenance  Breast Cancer Screening (q98yr):mammo + abus due 03/2020  Colorectal Cancer:12/27/2016 q5 years  Bone  Densiity: osteopenia 2020 w/ nml fraax  BMI: Estimated body mass index is 28.41 kg/m? as calculated from the following:    Height as of this encounter: 160 cm (63).    Weight as of this encounter: 72.8 kg (160 lb 6.4 oz).  Tobacco Screen (q2y):   Social History     Tobacco Use   Smoking Status Former Smoker   ? Packs/day: 1.00   ? Years: 20.00   ? Pack years: 20.00   ? Types: Cigarettes   ? Quit date: 11/03/1995   ? Years since quitting: 24.3   Smokeless Tobacco Never Used   Tobacco Comment    none x 12 years      Fall Risk Assessment: low risk  Post Menopaussal?  yes    Depression Screening:  Patient Scores:  PHQ-2: PHQ-2 Score: 1 (03/09/2020 10:34 AM)    PHQ-9: No data recorded  Interventions:  PHQ-2: PHQ-2 Score less than 3: No follow-up or recommendations are necessary at this time (03/09/2020 10:34 AM)    Depression Interventions PHQ-2/9: No data recorded    Social  She used to live in [place]. She has 2 sisters in the area.  Has step kids.   She worked in Education officer, environmental.  Works at [employer], [place] [employer]       Review of Systems   Constitutional: Negative for activity change, chills, fatigue and fever.   HENT: Negative for sore throat.    Eyes: Negative for visual disturbance.   Respiratory: Negative for cough, chest tightness, shortness of breath and wheezing.    Cardiovascular: Negative for chest pain and leg swelling.   Gastrointestinal: Positive for abdominal pain. Negative for blood in stool, constipation, diarrhea, nausea and vomiting.   Genitourinary: Negative for difficulty urinating and urgency.   Musculoskeletal: Negative for arthralgias and myalgias.   Skin: Negative for color change and rash.   Neurological: Negative for dizziness and light-headedness.   Hematological: Negative for adenopathy. Does not bruise/bleed easily.   Psychiatric/Behavioral: The patient is not nervous/anxious.    + easy bruising      Objective:         ? acetaminophen (TYLENOL 8 HOUR PO) Take 1 tablet by mouth every 8 hours as needed.   ? ALPRAZolam (XANAX) 0.25 mg tablet TAKE 1 TABLET BY MOUTH 3 TIMES A DAY AS NEEDED FOR ANXIETY   ? amLODIPine (NORVASC) 5 mg tablet TAKE 1 TABLET BY MOUTH EVERY DAY   ? diclofenac (VOLTAREN) 1 % topical gel Apply four g topically to affected area three times daily.   ? duloxetine DR (CYMBALTA) 60 mg capsule Take one capsule by mouth daily.   ? lidocaine (LIDODERM TP) Apply  topically to affected area.   ? lisinopriL (ZESTRIL) 20 mg tablet TAKE 1 TABLET BY MOUTH EVERY DAY   ? meloxicam (MOBIC) 7.5 mg tablet TAKE 1 TABLET BY MOUTH TWICE A DAY   ? omeprazole DR (PRILOSEC) 20 mg capsule Take one capsule by mouth daily before dinner.     Vitals:    03/09/20 1030   BP: 130/81   BP Source: Arm, Right Upper   Patient Position: Sitting   Pulse: 98   Weight: 72.8 kg (160 lb 6.4 oz)   Height: 160 cm (63)   PainSc: Two     Vitals:    03/09/20 1030   BP: 130/81   BP Source: Arm, Right Upper   Patient Position: Sitting   Pulse: 98   Weight: 72.8 kg (160  lb 6.4 oz)   Height: 160 cm (63)   PainSc: Two       Body mass index is 28.41 kg/m?Marland Kitchen     Physical Exam  Vitals and nursing note reviewed.   Constitutional:       Appearance: Normal appearance. She is well-developed.   HENT:      Head: Normocephalic and atraumatic.   Eyes:      Conjunctiva/sclera: Conjunctivae normal.   Cardiovascular:      Rate and Rhythm: Normal rate.   Pulmonary:      Effort: Pulmonary effort is normal.   Abdominal:      General: Bowel sounds are normal.   Musculoskeletal:         General: Normal range of motion.   Skin:     General: Skin is warm and dry.      Comments: Photos of bruising reviewed and show small, faint bruising and 1 skin tear.    Neurological:      Mental Status: She is alert.   Psychiatric:         Mood and Affect: Mood normal.         Behavior: Behavior normal.         Thought Content: Thought content normal.         Judgment: Judgment normal.         Labwork reviewed:  Lab Results   Component Value Date/Time    MCALBR <0.2 01/17/2012 10:23 AM    TSH 1.68 02/27/2019 10:11 AM    TSH3G 0.55 03/26/2016 09:09 AM    FREET4R 1.1 11/03/2010 02:15 PM    CHOL 211 (H) 02/27/2019 10:11 AM    TRIG 83 02/27/2019 10:11 AM    HDL 69 02/27/2019 10:11 AM    LDL 125 (H) 02/27/2019 10:11 AM    NA 139 02/27/2019 10:11 AM    K 4.1 02/27/2019 10:11 AM    CL 103 02/27/2019 10:11 AM    CO2 24 02/27/2019 10:11 AM    GAP 12 02/27/2019 10:11 AM    BUN 14 02/27/2019 10:11 AM    CR 0.93 02/27/2019 10:11 AM    GLU 100 02/27/2019 10:11 AM    GLU 72 09/12/2016 03:24 PM    CA 9.9 02/27/2019 10:11 AM    ALBUMIN 4.6 02/27/2019 10:11 AM    TOTPROT 7.1 02/27/2019 10:11 AM    ALKPHOS 66 02/27/2019 10:11 AM    AST 23 02/27/2019 10:11 AM    ALT 19 02/27/2019 10:11 AM    TOTBILI 0.5 02/27/2019 10:11 AM    GFR >60 02/27/2019 10:11 AM    GFRAA >60 02/27/2019 10:11 AM            Assessment and Plan:  Kimberly Phelps was seen today     Diagnoses and all orders for this visit:     Preventive Care Visit  Her BMI is addressed through diet and exercise. She is not depressed but mourning dog's death.   > dense breasts: abus + mammogram  > lipids, cbc, cmp, tsh, vitamin D  > flu shot, pna 23     Easy Bruising  Likely age related and possibly duloxetine. Reassurance.  > cbc    GERD  Education re: earlier dinners, elevating head of bed.   > 1 mo omeprazole trial, if sx persist after 1 mo let us know.     Hyperlipidemia  Intermediate ASCVD score discussed at our last visit.  Discussed importance of aerobic exercise and  Mediterrenean diet.  We discussed tools for success.     HTN (hypertension), benign:    Controlled with adding amlodipine  > continue lisinopril 20  > continue amlodipine 5mg   > monitor at local pharmacy, call if > 140/90     Lumbar facet arthropathy:   Chronic. Planning for another facet injection next week.  > continue duloxetine  > continue w/ Dr. Katrinka Blazing  > continue PT    Anxiety  Chronic and stable despite covid-19  > refill alprazolam prn                                         Patient Instructions     Lifestyle Changes for Controlling?GERD  When you have GERD, stomach acid feels as if it?s backing up toward your mouth. Making lifestyle changes can often improve your symptoms. This is true if you take medicine to control your GERD or not. Talk with your healthcare provider about the following suggestions. They may help you get relief from your symptoms.  Raise your head    Reflux is more likely to happen when you?re lying down flat. That's because stomach fluid can flow backward more easily. Raising the head of your bed?4 to 6 inches can help. To do this:  ? Slide blocks or books under the legs at the head of your bed. Or put a wedge under the mattress. Many foam stores can make a wedge for you. The wedge should go from your waist to the top of your head.  ? Don?t just prop your head up on a few pillows. This increases pressure on your stomach. It can make GERD worse.  Watch your eating habits  Certain foods may increase the acid in your stomach. Or they may relax the lower esophageal sphincter. This makes GERD more likely. It?s best to avoid the following if they cause you symptoms:  ? Coffee, tea, and carbonated drinks (with and without caffeine)  ? Fatty, fried, or spicy food  ? Mint, chocolate, onions, tomatoes, and citrus  ? Peppermint  ? Any other foods that seem to irritate your stomach or cause you pain  Relieve the pressure  Tips include the following:  ? Eat smaller meals, even if you have to eat more often.  ? Don?t lie down right after you eat. Wait a few hours for your stomach to empty.  ? Don't wear tight belts or tight-fitting clothes.  ? Lose any extra weight.  Tobacco and alcohol  Don't smoke tobacco or drink alcohol. They can make GERD symptoms worse.  StayWell last reviewed this educational content on 10/12/2017  ? 2000-2021 [employer], [place]. All rights reserved. This information is not intended as a substitute for professional medical care. Always follow your healthcare professional's instructions.        Omeprazole Delayed Release Oral Capsule 10 mg  Uses  This medicine is used for the following purposes:  ? indigestion  ? inflammation of stomach  ? inflammation of the esophagus  ? stomach acid  ? stomach acid reflux  ? ulcers in stomach or intestines  ? ulcers in stomach or intestines  Instructions  Swallow the medicine without crushing or chewing it.  Take the medicine before eating.  It is very important that you take the medicine at about the same time every day. It will work best if you do this.  Keep the  medicine at room temperature. Avoid heat and direct light.  It is important that you keep taking each dose of this medicine on time even if you are feeling well.  If you forget to take a dose on time, take it as soon as you remember. If it is almost time for the next dose, do not take the missed dose. Return to your normal dosing schedule. Do not take 2 doses of this medicine at one time.  Please tell your doctor and pharmacist about all the medicines you take. Include both prescription and over-the-counter medicines. Also tell them about any vitamins, herbal medicines, or anything else you take for your health.  If your symptoms do not improve or they worsen while on this medicine, contact your doctor.  Do not suddenly stop taking this medicine. Check with your doctor before stopping.  Cautions  Tell your doctor and pharmacist if you ever had an allergic reaction to a medicine. Symptoms of an allergic reaction can include trouble breathing, skin rash, itching, swelling, or severe dizziness.  Do not use the medication any more than instructed.  Contact your doctor if you notice a change in the amount or darkening of your urine.  Tell the doctor or pharmacist if you are pregnant, planning to be pregnant, or breastfeeding.  Ask your pharmacist if this medicine can interact with any of your other medicines. Be sure to tell them about all the medicines you take.  Do not start or stop any other medicines without first speaking to your doctor or pharmacist.  Do not share this medicine with anyone who has not been prescribed this medicine.  This medicine can cause serious side effects in some patients. Important information from the U.S. Food and Drug Administration (FDA) is available from your pharmacist. Please review it carefully with your pharmacist to understand the risks associated with this medicine.  Side Effects  The following is a list of some common side effects from this medicine. Please speak with your doctor about what you should do if you experience these or other side effects.  ? headaches  ? stomach upset or abdominal pain  Call your doctor or get medical help right away if you notice any of these more serious side effects:  ? diarrhea  ? pain in the joints  ? butterfly-shaped rash on nose and cheeks  A few people may have an allergic reaction to this medicine. Symptoms can include difficulty breathing, skin rash, itching, swelling, or severe dizziness. If you notice any of these symptoms, seek medical help quickly.  Extra  Please speak with your doctor, nurse, or pharmacist if you have any questions about this medicine.  [email or web address]  IMPORTANT NOTE: This document tells you briefly how to take your medicine, but it does not tell you all there is to know about it.Your doctor or pharmacist may give you other documents about your medicine. Please talk to them if you have any questions.Always follow their advice. There is a more complete description of this medicine available in [place].Scan this code on your smartphone or tablet or use the web address below. You can also ask your pharmacist for a printout. If you have any questions, please ask your pharmacist.   ? 2021 [employer]            Return in about 1 year (around 03/09/2021).

## 2020-03-23 ENCOUNTER — Encounter: Admit: 2020-03-23 | Discharge: 2020-03-23 | Payer: MEDICARE

## 2020-03-23 DIAGNOSIS — I10 Essential (primary) hypertension: Secondary | ICD-10-CM

## 2020-03-23 MED ORDER — LISINOPRIL 20 MG PO TAB
20 mg | ORAL_TABLET | Freq: Every day | ORAL | 1 refills | Status: AC
Start: 2020-03-23 — End: ?

## 2020-03-23 NOTE — Telephone Encounter
Pharmacy faxed refill request on lisinopril. It is a Standing order and the patient meets the Standing order requirements. I faxed the request back to the pharmacy. Jakaylee Sasaki, RN

## 2020-03-28 ENCOUNTER — Ambulatory Visit: Admit: 2020-03-28 | Discharge: 2020-03-28 | Payer: MEDICARE

## 2020-03-28 ENCOUNTER — Encounter: Admit: 2020-03-28 | Discharge: 2020-03-28 | Payer: MEDICARE

## 2020-03-28 DIAGNOSIS — M47816 Spondylosis without myelopathy or radiculopathy, lumbar region: Secondary | ICD-10-CM

## 2020-03-28 DIAGNOSIS — M545 Chronic lower back pain: Secondary | ICD-10-CM

## 2020-03-28 DIAGNOSIS — M1710 Unilateral primary osteoarthritis, unspecified knee: Secondary | ICD-10-CM

## 2020-03-28 DIAGNOSIS — F419 Anxiety disorder, unspecified: Secondary | ICD-10-CM

## 2020-03-28 DIAGNOSIS — I10 Essential (primary) hypertension: Secondary | ICD-10-CM

## 2020-03-28 MED ORDER — IOHEXOL 300 MG IODINE/ML IV SOLN
2 mL | Freq: Once | 0 refills | Status: CP
Start: 2020-03-28 — End: ?

## 2020-03-28 MED ORDER — LIDOCAINE (PF) 10 MG/ML (1 %) IJ SOLN
2 mL | Freq: Once | INTRAMUSCULAR | 0 refills | Status: CP
Start: 2020-03-28 — End: ?

## 2020-03-28 MED ORDER — DEXAMETHASONE SODIUM PHOS (PF) 10 MG/ML IJ SOLN
10 mg | Freq: Once | 0 refills | Status: CP
Start: 2020-03-28 — End: ?

## 2020-03-28 NOTE — Progress Notes
SPINE CENTER  INTERVENTIONAL PAIN PROCEDURE HISTORY AND PHYSICAL    Chief Complaint   Patient presents with   ? Procedure       HISTORY OF PRESENT ILLNESS:  Kimberly Phelps is a 69 y.o. year old female who presents for injection.  Denies fevers, chills, or recent hospitalizations.  Patient denies currently taking blood thinning medications.        Medical History:   Diagnosis Date   ? Anxiety    ? Chronic lower back pain    ? HTN (hypertension)    ? Lumbar facet arthropathy    ? Patellofemoral arthritis        Surgical History:   Procedure Laterality Date   ? BARTHOLIN GLAND CYST EXCISION  1978   ? ELBOW SURGERY Right 1989    scar tissue removal for tendonitis   ? HYDROGEN BREATH TEST N/A 12/12/2016    Performed by Jolee Ewing, MD at [facility] ENDO   ? COLONOSCOPY N/A 12/27/2016    Performed by Jolee Ewing, MD at [facility] OR   ? ESOPHAGOGASTRODUODENOSCOPY w/ bxs N/A 12/27/2016    Performed by Jolee Ewing, MD at [facility] OR       family history includes Basal Cell Carcinoma in her father; Cancer in her father; Essie Christine (age of onset: 61) in her mother; Depression in her sister; Hypertension in her father and mother; Migraines in her paternal grandmother and sister; Other in her father and mother; Seizures in her paternal uncle; Thyroid Disease in her sister.    Social History     Socioeconomic History   ? Marital status: Married     Spouse name: Not on file   ? Number of children: 0   ? Years of education: Not on file   ? Highest education level: Not on file   Occupational History   ? Occupation: Chiropractor: [employer]   Tobacco Use   ? Smoking status: Former Smoker     Packs/day: 1.00     Years: 20.00     Pack years: 20.00     Types: Cigarettes     Quit date: 11/03/1995     Years since quitting: 24.4   ? Smokeless tobacco: Never Used   ? Tobacco comment: none x 12 years   Substance and Sexual Activity   ? Alcohol use: Yes     Alcohol/week: 14.0 standard drinks     Types: 8 Glasses of wine, 6 Standard drinks or equivalent per week   ? Drug use: No   ? Sexual activity: Not on file   Other Topics Concern   ? Military Service Not Asked   ? Blood Transfusions Not Asked   ? Caffeine Concern Yes     Comment: 1-2 cups per day   ? Occupational Exposure Not Asked   ? Hobby Hazards Not Asked   ? Sleep Concern Not Asked   ? Stress Concern Not Asked   ? Weight Concern Not Asked   ? Special Diet Not Asked   ? Back Care Not Asked   ? Exercise Yes     Comment: 30- 40 minutes per day   ? Bike Helmet Not Asked   ? Seat Belt Not Asked   ? Self-Exams Not Asked   Social History Narrative   ? Not on file       Allergies   Allergen Reactions   ? Erythromycin NAUSEA AND VOMITING       Vitals:  03/28/20 1447   BP: (!) 125/91   BP Source: Arm, Left Upper   Temp: 36.5 ?C (97.7 ?F)   SpO2: 98%   Weight: 72.6 kg (160 lb)   Height: 160 cm (63)       REVIEW OF SYSTEMS: 10 point ROS obtained and negative except for back pain      PHYSICAL EXAM:  General: 69 y.o. female appears stated age, in no acute distress  HEENT: Normocephalic, atraumatic  Neck: No thyroidmegaly  Cardiovascular: Well perfused  Pulmonary: Unlabored respirations  Extremities: No cyanosis, clubbing, or edema  Skin: No lesions seen on exposed skin  Psychiatric:  Appropriate mood and affect  Musculoskeletal: No atrophy.   Neurologic: Antigravity strength in all extremities. CN II -XII grossly intact.  Alert and oriented x 3.         IMPRESSION:    1. Lumbar facet arthropathy         PLAN:   Bilateral L5-S1 facet injections

## 2020-03-28 NOTE — Discharge Instructions - Supplementary Instructions
GENERAL POST PROCEDURE INSTRUCTIONS  Physician: _________________________________  Procedure Completed Today:  Joint Injection (hip, knee, shoulder)  Cervical Epidural Steroid Injection  Cervical Transforaminal Steroid Injection  Trigger Point Injection  Caudal Epidural Steroid Injection  Pudendal Nerve Block  Other _____________________ Thoracic Epidural Steroid Injection  Lumbar Epidural Steroid Injection  Lumbar Transforaminal Steroid Injection  Facet Joint Injection  Celiac Nerve Block  Sacrococcygeal  Sacroiliac Joint Injection   Important information following your procedure today:  You may drive today     If you had sedation, you may NOT drive today  Rest at home for the next 6 hours.  You may then begin to resume your normal activities.  DO NOT drive any vehicle, operate any power tools, drink alcohol, make any major decisions, or sign any legal documents for the next 12 hours.  Pain relief may not be immediate. It is possible you may even experience an increase in pain during the first 24-48 hours followed by a gradual decrease of your pain.  Though the procedure is generally safe, and complications are rare, we do ask that you be aware of any of the following:  Any swelling, persistent redness, new bleeding or drainage from the site of the injection.  You should not experience a severe headache.  You should not run a fever over 101oF.  New onset of sharp, severe back and or neck pain.  New onset of upper or lower extremity numbness or weakness.  New difficulty controlling bowel or bladder function after injection.  New shortness of breath.  ** If any of these occur, please call to report this occurrence to a nurse at [phone]. If you are calling after 4:00 p.m. or on weekends or holidays, please call [phone] and ask to have the resident physician on call for the physician paged or go to your local emergency room.  You may experience soreness at the injection site. Ice can be applied at 20-minute intervals for the first 24 hours. The following day you may alternate ice with heat if you are experiencing muscle tightness, otherwise continue with ice. Ice works best at decreasing pain. Avoid application of direct heat, hot showers or hot tubs today.  Avoid strenuous activity today. You many resume your regular activities and exercise tomorrow.  Patients with diabetes may see an elevation in blood sugars for 7-10 days after the injection. It is important to pay close attention to your diet, check your blood sugars daily and report extreme elevations to the physician that manages your diabetes.  Patients taking daily blood thinners can resume their regular dose this evening.  It is important that you take all medications ordered by your pain physician. Taking medications as ordered is an important part of your pain care plan. If you cannot continue the medication plan, please notify the physician.    Possible side effects to steroids that may occur:  Flushing or redness of the face  Irritability  Fluid retention  Change in women's menses  Minor headache    If you are unable to keep your upcoming appointment, please notify the Spine Center scheduler at [phone] at least 24 hours in advance. If you have questions for the surgery center, call [facility] at [phone].

## 2020-03-28 NOTE — Procedures
Attending Surgeon: Joanie Coddington, MD    Anesthesia: Local    Pre-Procedure Diagnosis:   1. Lumbar facet arthropathy        Post-Procedure Diagnosis:   1. Lumbar facet arthropathy        Facet Lumbar/Sacral  Procedure: paravertebral facet    Laterality: bilateral    Location: - L5-S1      Consent:   Consent obtained: verbal and written  Consent given by: patient  Risks discussed: bleeding, infection, nerve damage, no change or worsening in pain, bruising and reaction to medication    Discussed with patient the purpose of the treatment/procedure, other ways of treating my condition, including no treatment/ procedure and the risks and benefits of the alternatives. Patient has decided to proceed with treatment/procedure.        Universal Protocol:  Relevant documents: relevant documents present and verified  Test results: test results available and properly labeled  Imaging studies: imaging studies available  Required items: required blood products, implants, devices, and special equipment available  Site marked: the operative site was marked  Patient identity confirmed: Patient identify confirmed verbally with patient.        Time out: Immediately prior to procedure a time out was called to verify the correct patient, procedure, equipment, support staff and site/side marked as required      Procedures Details:   Indications: pain   Prep: chlorhexidine  Patient position: prone  Estimated Blood Loss: minimal  Specimens: none  Number of Joints: 1  Guidance: fluoroscopy  Needle and Epidural Catheter: quincke  Needle size: 25 G  Injection procedure: Negative aspiration for blood  Patient tolerance: Patient tolerated the procedure well with no immediate complications. Pressure was applied, and hemostasis was accomplished.  Comments: DESCRIPTION OF PROCEDURE:  The procedure risks and benefits were explained and informed consent was obtained from the patient.  The patient was placed in prone position on fluoroscopy table.  Blood pressure cuff and oxygen saturation monitors were attached and the patient was monitored throughout the procedure. The skin was prepped using Chlorhexadine and draped in aseptic fashion.  The C-arm was rotated obliquely towards the right side to visualize the right L5-S1 facet joint.  The skin and subcutaneous tissue was anesthetized using 2 mL of 1 percent lidocaine with 27-gauge needle.  A 3.5 inch 25-gauge spinal needle was advanced parallel to the x-ray beam towards the right L5-S1 facet joint.  The tip of the needle made contact with the bone and was slowly walked off and inserted into the facet joint.  After negative aspiration, 0.2 mL of Isovue contrast dye was injected and demonstrated articular flow.  Subsequently, a solution containing. 0.25 mL of 1 percent lidocaine and 5 mg of dexamethasone was injected into the right L5-S1 facet.  The stylet was reinserted and the needle was then removed.     Subsequently, attention was taken to the left side, where the left L5-S1 facet joint were visualized with fluoroscopy in the oblique view.  The skin and subcutaneous tissue was anesthetized using 2 mL of 1 percent lidocaine with 27-gauge needle.  A 3.5 inch 25-gauge spinal needle was advanced parallel to the x-ray beam towards the left L5-S1 facet joint.  The needles were advanced slowly until the tip of the needle made contact with the bone and was subsequently walked off and advanced into the facet joint.   After negative aspiration, 0.2 mL of Isovue contrast dye was injected and arthrogram was seen.  A mixture containing  0.25 mL of 1 percent lidocaine and 5 mg of dexamethasone was injected into the left L5-S1 facet joint.  The stylet was reinserted and needle was then removed.  There is no sensory or motor deficit of extremity noted.  After the procedure, the patient's blood pressure, heart rate, and oxygen saturation were recorded in the chart.     There were no complications.  The patient tolerated the procedure well and was brought to the recovery room for observation in stable condition and discharged with written discharge instructions.     PLAN OF CARE:  The patient is to followup in 6 weeks.    The patient was advised to contact the [facility] for any of the following    Fever, chills, or night sweats.  New onset of severe, sharp pain.  Any new upper or lower extremity weakness or numbness.  Any questions regarding the procedure.    If unable to contact the [facility], the patient instructed to go to the local emergency room.           Estimated blood loss: none or minimal  Specimens: none  Patient tolerated the procedure well with no immediate complications. Pressure was applied, and hemostasis was accomplished.

## 2020-04-07 ENCOUNTER — Encounter: Admit: 2020-04-07 | Discharge: 2020-04-07 | Payer: MEDICARE

## 2020-04-07 MED ORDER — OMEPRAZOLE 20 MG PO CPDR
20 mg | ORAL_CAPSULE | Freq: Every day | ORAL | 0 refills
Start: 2020-04-07 — End: ?

## 2020-04-22 ENCOUNTER — Encounter: Admit: 2020-04-22 | Discharge: 2020-04-22 | Payer: MEDICARE

## 2020-04-22 ENCOUNTER — Ambulatory Visit: Admit: 2020-04-22 | Discharge: 2020-04-22 | Payer: MEDICARE

## 2020-04-22 DIAGNOSIS — M545 Chronic lower back pain: Secondary | ICD-10-CM

## 2020-04-22 DIAGNOSIS — M47816 Spondylosis without myelopathy or radiculopathy, lumbar region: Secondary | ICD-10-CM

## 2020-04-22 DIAGNOSIS — M1710 Unilateral primary osteoarthritis, unspecified knee: Secondary | ICD-10-CM

## 2020-04-22 DIAGNOSIS — I10 Essential (primary) hypertension: Secondary | ICD-10-CM

## 2020-04-22 DIAGNOSIS — M48062 Spinal stenosis, lumbar region with neurogenic claudication: Secondary | ICD-10-CM

## 2020-04-22 DIAGNOSIS — M25559 Pain in unspecified hip: Secondary | ICD-10-CM

## 2020-04-22 DIAGNOSIS — F419 Anxiety disorder, unspecified: Secondary | ICD-10-CM

## 2020-04-22 NOTE — Progress Notes
SPINE CENTER CLINIC NOTE       SUBJECTIVE:   Kimberly Phelps is a 69 y.o.-year-old female with history of hypertension, who presents for follow-up after bilateral L5-S1 facet injection on 03/28/2020. Patient reports significant relief with the injections 95. Patient reports at least 90 95% reduction in pain in the center of the lumbar spine. She has had this injection previously with greater than 90% relief for more than 6 months. However, patient reports increasing pain in bilateral glutes and lateral hips. She has continued working with aquatic physical therapy with improvement in her strength. She continues to have pain in the glutes and the lateral hips. She is interested in additional treatment options at this time for the glutes and hips. She endorses radiation to the hips and glutes but does not radiate down the leg. She has intermittent numbness if she stands for prolonged period of time in bilateral legs. VAS pain score is rated 2 out of 10 today. She has not changed any of her medications. Patient states after facet injections she is more active and able to do activities of daily living with less pain. She denies any balance difficulties. Denies any recent falls. Denies any loss of control of her bowel or bladder.        Review of Systems    Current Outpatient Medications:   ?  acetaminophen (TYLENOL 8 HOUR PO), Take 1 tablet by mouth every 8 hours as needed., Disp: , Rfl:   ?  ALPRAZolam (XANAX) 0.25 mg tablet, TAKE 1 TABLET BY MOUTH 3 TIMES A DAY AS NEEDED FOR ANXIETY, Disp: 30 tablet, Rfl: 5  ?  amLODIPine (NORVASC) 5 mg tablet, TAKE 1 TABLET BY MOUTH EVERY DAY, Disp: 90 tablet, Rfl: 1  ?  diclofenac (VOLTAREN) 1 % topical gel, Apply four g topically to affected area three times daily., Disp: 300 g, Rfl: 3  ?  duloxetine DR (CYMBALTA) 60 mg capsule, Take one capsule by mouth daily., Disp: 90 capsule, Rfl: 1  ?  lidocaine (LIDODERM TP), Apply  topically to affected area., Disp: , Rfl:   ? lisinopriL (ZESTRIL) 20 mg tablet, Take one tablet by mouth daily., Disp: 90 tablet, Rfl: 1  Allergies   Allergen Reactions   ? Erythromycin NAUSEA AND VOMITING     Physical Exam  Vitals:    04/22/20 0848   BP: (!) 139/95   BP Source: Arm, Left Upper   Patient Position: Sitting   Pulse: 93   SpO2: 99%   Weight: 72.6 kg (160 lb)   Height: 160 cm (63)   PainSc: Two        Pain Score: Two  Body mass index is 28.34 kg/m?Marland Kitchen  General: 69 y.o. female appears stated age, in no acute distress  HEENT: Normocephalic, atraumatic  Neck: No thyroidmegaly  Cardiovascular: Well perfused  Pulmonary: Unlabored respirations  Extremities: No cyanosis, clubbing, or edema  Skin: Warm and dry  Psychiatric:  Appropriate mood and affect  Musculoskeletal: Full range of motion with lumbar flexion, extension, and lateral rotation.  Tender to palpation at bilateral gluteus medius and greater trochanter more than bilateral PSIS. No tenderness to palpation of the lumbar facets. Facet loading is negative bilaterally.  FABER is negative bilaterally.   FAIR is negative bilaterally.   Neurologic: Weakness with bilateral hip abduction and EHL bilaterally 4/5 otherwise, Lower extremity myotomes are all 5/5.  Lower extremity dermatomes are all intact to light touch.  Deep tendon reflexes are symmetric at patella and achilles.  Negative slump test bilaterally.  Downward Babinski.  No ankle clonus.        IMPRESSION:  1. Greater trochanteric pain syndrome    2. Lumbar stenosis with neurogenic claudication    3. Lumbar facet arthropathy      PLAN:    1.  Lifestyle modifications.  Recommend activity as tolerated.  Avoid provocative maneuvers.  Keep spine in neutral position.  2.  Medications. No changes indicated at this time. Continue with medications as previously prescribed.  3.  Therapy. Patient provided with updated aquatic physical therapy prescription today to focus on hip and gluteal strengthening as well as continue with lumbar and core strengthening.  4.  Imaging. None indicated at this time.  5.  Interventions. Discussed with patient I believe her pain from the facets has improved with the facet injections however, I believe she is having symptoms now from her stenosis. I would recommend scheduling for L5-S1 interlaminar epidural steroid injection. If she does not have improvement with continued aquatic therapy or is unable to tolerate, I would proceed with the epidural at that time. If she has improvement with therapy, I would cancel the injection. Risks of the procedure were discussed including pain, bleeding, infection, damage nearby structures. Patient has elected proceed with injection at this time.  6.  Follow-up.  Patient to follow-up for injection in late January early February if needed.

## 2020-04-27 ENCOUNTER — Encounter: Admit: 2020-04-27 | Discharge: 2020-04-27 | Payer: MEDICARE

## 2020-05-16 ENCOUNTER — Encounter: Admit: 2020-05-16 | Discharge: 2020-05-16 | Payer: MEDICARE

## 2020-05-16 MED ORDER — DULOXETINE 60 MG PO CPDR
ORAL_CAPSULE | Freq: Every day | ORAL | 1 refills | 60.00000 days | Status: AC
Start: 2020-05-16 — End: ?

## 2020-05-16 NOTE — Telephone Encounter
[facility]    Patient last seen 03/09/20 with plan to continue Cymbalta 60mg  capsule daily.    No follow up appointment scheduled at this time.    3 months and 1 refills e-scribed per protocol.    , RN

## 2020-05-27 ENCOUNTER — Encounter: Admit: 2020-05-27 | Discharge: 2020-05-27 | Payer: MEDICARE

## 2020-05-27 NOTE — Telephone Encounter
Per Bella Kennedy, plan to appeal denial of LESI by insurance. Appeal letter written. Awaiting signature from Dr. Katrinka Blazing and will forward to insurance    Trudie Buckler, RN

## 2020-06-01 ENCOUNTER — Encounter: Admit: 2020-06-01 | Discharge: 2020-06-01 | Payer: MEDICARE

## 2020-06-01 NOTE — Telephone Encounter
Received VM from [facility] requesting call back to discuss upcoming procedure.

## 2020-06-01 NOTE — Telephone Encounter
Spoke to patient about upcoming procedure and denial. We are attempting to appeal decision but made patient aware that we may have to cancel her procedure if unable to get approval prior to Monday. Patient agreeable.    Appeal letter faxed to [employer]    Trudie Buckler, RN

## 2020-06-03 ENCOUNTER — Encounter: Admit: 2020-06-03 | Discharge: 2020-06-03 | Payer: MEDICARE

## 2020-06-03 NOTE — Telephone Encounter
Expedited appeal letter was sent to [facility] 06/01/2020 at 9:35 AM CST for DOS 06/06/2020. Pt was cancelled by someone at 3:05 PM today. This RN reached out to insurance appeal dpmt at 3:50 PM; was transferred several times, and finally hung up after 30 minutes (no option to leave a message). Then sent the appeal letter again via fax asking for a return fax Monday with a final decision. Then called Pt to advise her we continue to work on this and that we will call to reschedule her in the event that denial is overturned. Pt expressed appreciation for the call and all efforts made on her behalf. Will await our call with final decision either way. Time spent: 30 minutes.

## 2020-06-08 ENCOUNTER — Encounter: Admit: 2020-06-08 | Discharge: 2020-06-08 | Payer: MEDICARE

## 2020-06-08 DIAGNOSIS — F419 Anxiety disorder, unspecified: Secondary | ICD-10-CM

## 2020-06-08 MED ORDER — ALPRAZOLAM 0.25 MG PO TAB
ORAL_TABLET | Freq: Three times a day (TID) | 0 refills | Status: AC | PRN
Start: 2020-06-08 — End: ?

## 2020-06-08 NOTE — Telephone Encounter
[facility]  Last refill was 09/30/2019 (#30 X5).   Last office visit was 03/09/2020, next office visit is N/A.   Medication is not under standing order protocol.   Routing to Dr. Annia Belt for approval.    Sula Soda, MA

## 2020-06-13 ENCOUNTER — Encounter: Admit: 2020-06-13 | Discharge: 2020-06-13 | Payer: MEDICARE

## 2020-06-13 ENCOUNTER — Ambulatory Visit: Admit: 2020-06-13 | Discharge: 2020-06-13 | Payer: MEDICARE

## 2020-06-13 DIAGNOSIS — M545 Chronic lower back pain: Secondary | ICD-10-CM

## 2020-06-13 DIAGNOSIS — M1710 Unilateral primary osteoarthritis, unspecified knee: Secondary | ICD-10-CM

## 2020-06-13 DIAGNOSIS — M47816 Spondylosis without myelopathy or radiculopathy, lumbar region: Secondary | ICD-10-CM

## 2020-06-13 DIAGNOSIS — I10 Essential (primary) hypertension: Secondary | ICD-10-CM

## 2020-06-13 DIAGNOSIS — R52 Pain, unspecified: Secondary | ICD-10-CM

## 2020-06-13 DIAGNOSIS — M48062 Spinal stenosis, lumbar region with neurogenic claudication: Secondary | ICD-10-CM

## 2020-06-13 DIAGNOSIS — F419 Anxiety disorder, unspecified: Secondary | ICD-10-CM

## 2020-06-13 MED ORDER — TRIAMCINOLONE ACETONIDE 40 MG/ML IJ SUSP
80 mg | Freq: Once | EPIDURAL | 0 refills | Status: CP
Start: 2020-06-13 — End: ?

## 2020-06-13 MED ORDER — IOHEXOL 300 MG IODINE/ML IV SOLN
2 mL | Freq: Once | 0 refills | Status: CP
Start: 2020-06-13 — End: ?

## 2020-06-13 MED ORDER — LIDOCAINE (PF) 10 MG/ML (1 %) IJ SOLN
2 mL | Freq: Once | INTRAMUSCULAR | 0 refills | Status: CP
Start: 2020-06-13 — End: ?

## 2020-06-13 NOTE — Procedures
Attending Surgeon: Joanie Coddington, MD    Anesthesia: Local    Pre-Procedure Diagnosis:   1. Lumbar stenosis with neurogenic claudication        Post-Procedure Diagnosis:   1. Lumbar stenosis with neurogenic claudication        Epidural Steroid Injection Lumbar/Caudal  Procedure: epidural - interlaminar    Laterality: n/a   on 06/13/2020 3:30 PM  Location: lumbar ESI with imaging -  L5-S1      Consent:   Consent obtained: verbal and written  Consent given by: patient  Risks discussed: allergic reaction, bleeding, bruising, infection, nerve damage, no change or worsening in pain, weakness and reaction to medication    Discussed with patient the purpose of the treatment/procedure, other ways of treating my condition, including no treatment/ procedure and the risks and benefits of the alternatives. Patient has decided to proceed with treatment/procedure.        Universal Protocol:  Relevant documents: relevant documents present and verified  Test results: test results available and properly labeled  Imaging studies: imaging studies available  Required items: required blood products, implants, devices, and special equipment available  Site marked: the operative site was marked  Patient identity confirmed: Patient identify confirmed verbally with patient.        Time out: Immediately prior to procedure a time out was called to verify the correct patient, procedure, equipment, support staff and site/side marked as required      Procedures Details:   Indications: pain   Prep: chlorhexidine  Patient position: prone  Estimated Blood Loss: minimal  Specimens: none  Number of Joints: 1  Approach: midline  Guidance: fluoroscopy  Contrast: Procedure confirmed with contrast under live fluoroscopy.  Needle and Epidural Catheter: tuohy  Needle size: 20 G  Injection procedure: Incremental injection and Negative aspiration for blood  Patient tolerance: Patient tolerated the procedure well with no immediate complications. Pressure was applied, and hemostasis was accomplished.  Comments: DESCRIPTION OF PROCEDURE:  The procedure risks and benefits were explained to the patient and informed consent was obtained.  The patient was positioned prone on the fluoroscopy table with a pillow under the abdomen to help reduce lumbar lordosis.  Blood pressure cuff and oxygen saturation monitors were attached and the patient was monitored throughout the entire procedure.  The L5 vertebral level was identified with the use of fluoroscopy in the AP view.  The skin was prepped using Chlorhexadine and draped in aseptic fashion.  The skin and subcutaneous tissue were anesthetized using 3 mL of 1 percent lidocaine with a 27-gauge needle.  A 3.5 inch 22-gauge Tuohy needle was slowly advanced using AP view towards the midline L5-S1 epidural space.  The latter part of the needle advancement was guided with fluoroscopy in the lateral view.  The epidural space was identified using loss of resistance technique.  After negative aspiration, 1 mL of contrast dye was injected.  After epidural spread was seen, a solution containing 80 mg of triamcinolone and 2 mL of normal saline was injected in increments.  The stylet was reinserted and the needle was then removed.     After the procedure, the patient's blood pressure, heart rate, oxygen saturation, and VAS were recorded in the chart. There were no complications.  The patient tolerated the procedure well and was brought to recovery room for observation in stable condition and discharged with written discharge instructions.     PLAN OF CARE:  The patient is to followup in 3 weeks.  The patient was advised to contact the [facility] for any of the following    Fever, chills, or night sweats.  New onset of severe sharp pain.  Any new upper or lower extremity weakness or numbness.  Any questions regarding the procedure.            Estimated blood loss: none or minimal  Specimens: none  Patient tolerated the procedure well with no immediate complications. Pressure was applied, and hemostasis was accomplished.

## 2020-06-13 NOTE — Discharge Instructions - Supplementary Instructions
GENERAL POST PROCEDURE INSTRUCTIONS  Physician: _________________________________  Procedure Completed Today:  Joint Injection (hip, knee, shoulder)  Cervical Epidural Steroid Injection  Cervical Transforaminal Steroid Injection  Trigger Point Injection  Caudal Epidural Steroid Injection  Pudendal Nerve Block  Other _____________________ Thoracic Epidural Steroid Injection  Lumbar Epidural Steroid Injection  Lumbar Transforaminal Steroid Injection  Facet Joint Injection  Celiac Nerve Block  Sacrococcygeal  Sacroiliac Joint Injection   Important information following your procedure today:  You may drive today     If you had sedation, you may NOT drive today  Rest at home for the next 6 hours.  You may then begin to resume your normal activities.  DO NOT drive any vehicle, operate any power tools, drink alcohol, make any major decisions, or sign any legal documents for the next 12 hours.  Pain relief may not be immediate. It is possible you may even experience an increase in pain during the first 24-48 hours followed by a gradual decrease of your pain.  Though the procedure is generally safe, and complications are rare, we do ask that you be aware of any of the following:  Any swelling, persistent redness, new bleeding or drainage from the site of the injection.  You should not experience a severe headache.  You should not run a fever over 101oF.  New onset of sharp, severe back and or neck pain.  New onset of upper or lower extremity numbness or weakness.  New difficulty controlling bowel or bladder function after injection.  New shortness of breath.  ** If any of these occur, please call to report this occurrence to a nurse at [phone]. If you are calling after 4:00 p.m. or on weekends or holidays, please call [phone] and ask to have the resident physician on call for the physician paged or go to your local emergency room.  You may experience soreness at the injection site. Ice can be applied at 20-minute intervals for the first 24 hours. The following day you may alternate ice with heat if you are experiencing muscle tightness, otherwise continue with ice. Ice works best at decreasing pain. Avoid application of direct heat, hot showers or hot tubs today.  Avoid strenuous activity today. You many resume your regular activities and exercise tomorrow.  Patients with diabetes may see an elevation in blood sugars for 7-10 days after the injection. It is important to pay close attention to your diet, check your blood sugars daily and report extreme elevations to the physician that manages your diabetes.  Patients taking daily blood thinners can resume their regular dose this evening.  It is important that you take all medications ordered by your pain physician. Taking medications as ordered is an important part of your pain care plan. If you cannot continue the medication plan, please notify the physician.    Possible side effects to steroids that may occur:  Flushing or redness of the face  Irritability  Fluid retention  Change in women's menses  Minor headache    If you are unable to keep your upcoming appointment, please notify the Spine Center scheduler at [phone] at least 24 hours in advance. If you have questions for the surgery center, call [facility] at [phone].

## 2020-06-13 NOTE — Progress Notes
SPINE CENTER  INTERVENTIONAL PAIN PROCEDURE HISTORY AND PHYSICAL    Chief Complaint: Pain    HISTORY OF PRESENT ILLNESS:  Kimberly Phelps is a 70 y.o. year old female who presents for injection.  Denies fevers, chills, or recent hospitalizations.  Patient denies currently taking blood thinning medications.        Medical History:   Diagnosis Date   ? Anxiety    ? Chronic lower back pain    ? HTN (hypertension)    ? Lumbar facet arthropathy    ? Patellofemoral arthritis        Surgical History:   Procedure Laterality Date   ? BARTHOLIN GLAND CYST EXCISION  1978   ? ELBOW SURGERY Right 1989    scar tissue removal for tendonitis   ? HYDROGEN BREATH TEST N/A 12/12/2016    Performed by Jolee Ewing, MD at [facility]   ? COLONOSCOPY N/A 12/27/2016    Performed by Jolee Ewing, MD at [facility]   ? ESOPHAGOGASTRODUODENOSCOPY w/ bxs N/A 12/27/2016    Performed by Jolee Ewing, MD at [facility] OR       family history includes Basal Cell Carcinoma in her father; Cancer in her father; Essie Christine (age of onset: 72) in her mother; Depression in her sister; Hypertension in her father and mother; Migraines in her paternal grandmother and sister; Other in her father and mother; Seizures in her paternal uncle; Thyroid Disease in her sister.    Social History     Socioeconomic History   ? Marital status: Married     Spouse name: Not on file   ? Number of children: 0   ? Years of education: Not on file   ? Highest education level: Not on file   Occupational History   ? Occupation: Chiropractor: [employer]   Tobacco Use   ? Smoking status: Former Smoker     Packs/day: 1.00     Years: 20.00     Pack years: 20.00     Types: Cigarettes     Quit date: 11/03/1995     Years since quitting: 24.6   ? Smokeless tobacco: Never Used   ? Tobacco comment: none x 12 years   Substance and Sexual Activity   ? Alcohol use: Yes     Alcohol/week: 14.0 standard drinks     Types: 8 Glasses of wine, 6 Standard drinks or equivalent per week   ? Drug use: No   ? Sexual activity: Not on file   Other Topics Concern   ? Military Service Not Asked   ? Blood Transfusions Not Asked   ? Caffeine Concern Yes     Comment: 1-2 cups per day   ? Occupational Exposure Not Asked   ? Hobby Hazards Not Asked   ? Sleep Concern Not Asked   ? Stress Concern Not Asked   ? Weight Concern Not Asked   ? Special Diet Not Asked   ? Back Care Not Asked   ? Exercise Yes     Comment: 30- 40 minutes per day   ? Bike Helmet Not Asked   ? Seat Belt Not Asked   ? Self-Exams Not Asked   Social History Narrative   ? Not on file       Allergies   Allergen Reactions   ? Erythromycin NAUSEA AND VOMITING       There were no vitals filed for this visit.  REVIEW OF SYSTEMS: 10 point ROS obtained and negative except for back pain      PHYSICAL EXAM:  General: 70 y.o. female appears stated age, in no acute distress  HEENT: Normocephalic, atraumatic  Neck: No thyroidmegaly  Cardiovascular: Well perfused  Pulmonary: Unlabored respirations  Extremities: No cyanosis, clubbing, or edema  Skin: No lesions seen on exposed skin  Psychiatric:  Appropriate mood and affect  Musculoskeletal: No atrophy.   Neurologic: Antigravity strength in all extremities. CN II -XII grossly intact.  Alert and oriented x 3.         IMPRESSION:    1. Lumbar stenosis with neurogenic claudication         PLAN:   L5-S1 ILESI

## 2020-07-04 ENCOUNTER — Encounter

## 2020-07-04 DIAGNOSIS — I10 Essential (primary) hypertension: Secondary | ICD-10-CM

## 2020-07-04 MED ORDER — AMLODIPINE 5 MG PO TAB
5 mg | ORAL_TABLET | Freq: Every day | ORAL | 1 refills | Status: AC
Start: 2020-07-04 — End: ?

## 2020-07-04 NOTE — Telephone Encounter
[facility].  Last refill was 02/17/20 ( #90 x1).  Last visit was 03/09/20, next visit is 07/06/20.  This medication is under standing order protocol.   Jonathon Bellows, LPN

## 2020-07-06 ENCOUNTER — Encounter: Admit: 2020-07-06 | Discharge: 2020-07-06 | Payer: MEDICARE

## 2020-07-06 ENCOUNTER — Ambulatory Visit: Admit: 2020-07-06 | Discharge: 2020-07-06 | Payer: MEDICARE

## 2020-07-06 DIAGNOSIS — I10 Essential (primary) hypertension: Secondary | ICD-10-CM

## 2020-07-06 DIAGNOSIS — Z7289 Other problems related to lifestyle: Secondary | ICD-10-CM

## 2020-07-06 DIAGNOSIS — M1710 Unilateral primary osteoarthritis, unspecified knee: Secondary | ICD-10-CM

## 2020-07-06 DIAGNOSIS — F419 Anxiety disorder, unspecified: Secondary | ICD-10-CM

## 2020-07-06 DIAGNOSIS — M48062 Spinal stenosis, lumbar region with neurogenic claudication: Secondary | ICD-10-CM

## 2020-07-06 DIAGNOSIS — M545 Chronic lower back pain: Secondary | ICD-10-CM

## 2020-07-06 DIAGNOSIS — Z92241 Personal history of systemic steroid therapy: Secondary | ICD-10-CM

## 2020-07-06 DIAGNOSIS — M47816 Spondylosis without myelopathy or radiculopathy, lumbar region: Secondary | ICD-10-CM

## 2020-07-06 LAB — COMPREHENSIVE METABOLIC PANEL
Lab: 0.4 mg/dL (ref 0.3–1.2)
Lab: 0.9 mg/dL (ref 0.4–1.00)
Lab: 105 MMOL/L (ref 98–110)
Lab: 141 MMOL/L (ref 137–147)
Lab: 18 mg/dL (ref 7–25)
Lab: 24 U/L (ref 7–40)
Lab: 26 U/L (ref 7–56)
Lab: 29 MMOL/L (ref 21–30)
Lab: 4.1 g/dL (ref 3.5–5.0)
Lab: 4.9 MMOL/L (ref 3.5–5.1)
Lab: 6.1 g/dL (ref 6.0–8.0)
Lab: 63 U/L (ref 25–110)
Lab: 7 (ref 3–12)
Lab: 85 mg/dL (ref 70–100)
Lab: 9.7 mg/dL (ref 8.5–10.6)
Lab: >60 mL/min (ref >60)

## 2020-07-06 LAB — CBC
Lab: 10 K/UL (ref 4.5–11.0)
Lab: 4.4 M/UL (ref 4.0–5.0)

## 2020-07-12 ENCOUNTER — Encounter: Admit: 2020-07-12 | Discharge: 2020-07-12 | Payer: MEDICARE

## 2020-07-12 DIAGNOSIS — Z0000 Encounter for general adult medical examination without abnormal findings: Secondary | ICD-10-CM

## 2020-07-12 DIAGNOSIS — Z1231 Encounter for screening mammogram for malignant neoplasm of breast: Secondary | ICD-10-CM

## 2020-07-12 DIAGNOSIS — R922 Inconclusive mammogram: Secondary | ICD-10-CM

## 2020-07-14 ENCOUNTER — Encounter: Admit: 2020-07-14 | Discharge: 2020-07-14 | Payer: MEDICARE

## 2020-08-25 ENCOUNTER — Encounter: Admit: 2020-08-25 | Discharge: 2020-08-25 | Payer: MEDICARE

## 2020-08-25 NOTE — Telephone Encounter
Patient called and left a message on this nurse's machine in regards to sore throat, ears feeling clogged. She took a COVID test and it was negative. Called patient back and discussed with her that it is hard to tell what is goin gon without an evaluation. Offered patient an appointment with one of our nurse practitioners this afternoon. Patient stated that she has a [facility] less than five minutes away from her house and she will go there for an evaluation. Patient will call this nurse back if she needs anything else.Emilia Beck, RN

## 2020-09-30 ENCOUNTER — Encounter: Admit: 2020-09-30 | Discharge: 2020-09-30 | Payer: MEDICARE

## 2020-09-30 DIAGNOSIS — I10 Essential (primary) hypertension: Secondary | ICD-10-CM

## 2020-09-30 MED ORDER — LISINOPRIL 20 MG PO TAB
ORAL_TABLET | Freq: Every day | 1 refills | Status: AC
Start: 2020-09-30 — End: ?

## 2020-11-13 ENCOUNTER — Encounter: Admit: 2020-11-13 | Discharge: 2020-11-13 | Payer: MEDICARE

## 2020-11-13 MED ORDER — DULOXETINE 60 MG PO CPDR
ORAL_CAPSULE | Freq: Every day | 1 refills
Start: 2020-11-13 — End: ?

## 2020-12-23 ENCOUNTER — Encounter: Admit: 2020-12-23 | Discharge: 2020-12-23 | Payer: MEDICARE

## 2020-12-23 DIAGNOSIS — M48062 Spinal stenosis, lumbar region with neurogenic claudication: Secondary | ICD-10-CM

## 2021-01-03 ENCOUNTER — Encounter: Admit: 2021-01-03 | Discharge: 2021-01-03 | Payer: MEDICARE

## 2021-01-10 ENCOUNTER — Encounter: Admit: 2021-01-10 | Discharge: 2021-01-10 | Payer: MEDICARE

## 2021-01-10 NOTE — Telephone Encounter
I called patient, let her know we needed her to come in for an appointment to update her Oswestry before her procedure. Patient will be seen via telehealth tomorrow 8/31 @ 9 am. Kimberly Shams RN will add the appointment onto the schedule.

## 2021-01-11 ENCOUNTER — Encounter: Admit: 2021-01-11 | Discharge: 2021-01-11 | Payer: MEDICARE

## 2021-01-11 ENCOUNTER — Ambulatory Visit: Admit: 2021-01-11 | Discharge: 2021-01-12 | Payer: MEDICARE

## 2021-01-11 DIAGNOSIS — F419 Anxiety disorder, unspecified: Secondary | ICD-10-CM

## 2021-01-11 DIAGNOSIS — M1710 Unilateral primary osteoarthritis, unspecified knee: Secondary | ICD-10-CM

## 2021-01-11 DIAGNOSIS — M545 Chronic lower back pain: Secondary | ICD-10-CM

## 2021-01-11 DIAGNOSIS — M5416 Radiculopathy, lumbar region: Secondary | ICD-10-CM

## 2021-01-11 DIAGNOSIS — M47816 Spondylosis without myelopathy or radiculopathy, lumbar region: Secondary | ICD-10-CM

## 2021-01-11 DIAGNOSIS — I10 Essential (primary) hypertension: Secondary | ICD-10-CM

## 2021-01-11 NOTE — Progress Notes
Obtained patient's verbal consent to treat them and their agreement to [facility] financial policy and NPP via this telehealth visit during the [facility]    SPINE CENTER CLINIC NOTE       SUBJECTIVE: Kimberly Phelps is a 70 year old female with history of hypertension who presents for follow-up on low back pain.  The patient was last seen by nurse practitioner Suezanne Cheshire on 07/06/2020.  The patient underwent interlaminar epidural steroid injection in January.  She estimates 95% reduction in pain with the injection.  She had improved functional abilities and improve quality of life.  She has had increasing symptoms since that time.  She is not quite a preprocedure level, but this has affected her activities of daily living and quality of life.  She is interested in having this repeated.  Of note, the patient will be moving to [place], [place] in approximately 2 weeks.  She is interested in referral to physician in the area.         Review of Systems    Current Outpatient Medications:   ?  acetaminophen (TYLENOL 8 HOUR PO), Take 1 tablet by mouth every 8 hours as needed., Disp: , Rfl:   ?  ALPRAZolam (XANAX) 0.25 mg tablet, TAKE 1 TABLET BY MOUTH 3 TIMES A DAY AS NEEDED FOR ANXIETY, Disp: 30 tablet, Rfl: 0  ?  amLODIPine (NORVASC) 5 mg tablet, Take one tablet by mouth daily., Disp: 90 tablet, Rfl: 1  ?  diclofenac (VOLTAREN) 1 % topical gel, Apply four g topically to affected area three times daily., Disp: 300 g, Rfl: 3  ?  duloxetine DR (CYMBALTA) 60 mg capsule, TAKE 1 CAPSULE BY MOUTH EVERY DAY, Disp: 90 capsule, Rfl: 1  ?  lidocaine (LIDODERM TP), Apply  topically to affected area., Disp: , Rfl:   ?  lisinopriL (ZESTRIL) 20 mg tablet, TAKE 1 TABLET BY MOUTH EVERY DAY, Disp: 90 tablet, Rfl: 1  Allergies   Allergen Reactions   ? Erythromycin NAUSEA AND VOMITING     Physical Exam  Vitals:    01/11/21 0903   PainSc: Three   Weight: 69.4 kg (153 lb)   Height: 160 cm (5' 3)     Oswestry Total Score:: 34  Pain Score: Three  Body mass index is 27.1 kg/m?Marland Kitchen  General: 70 y.o. female appears stated age, in no acute distress  HEENT: Normocephalic, atraumatic  Neck: No thyroidmegaly  Cardiovascular: Well perfused  Pulmonary: Unlabored respirations  Extremities: No cyanosis, clubbing, or edema  Skin: No lesions seen on exposed skin  Psychiatric:  Appropriate mood and affect  Musculoskeletal: No atrophy.   Neurologic: Antigravity strength in all extremities. CN II -XII grossly intact.  Alert and oriented x 3.            IMPRESSION:  1. Lumbar radiculopathy          PLAN:    1.  Lifestyle modification.  Recommend activity as tolerated.  2.  Medication.  Continue medications as previous prescribed.  3.  Therapy.  Continue with physical therapy efforts.  4.  Interventions.  I would recommend repeating L5-S1 interlaminar epidural steroid injection.  We discussed risk and benefits of the procedure, and she is like to proceed.  We will perform on Tuesday.  5.  Follow-up.  Patient is a follow-up for procedure.          Total time 10 minutes.  Estimated counseling time 5 minutes.  Counseled Ms Bernales regarding the issues above.

## 2021-01-12 ENCOUNTER — Encounter: Admit: 2021-01-12 | Discharge: 2021-01-12 | Payer: MEDICARE

## 2021-01-12 DIAGNOSIS — F419 Anxiety disorder, unspecified: Secondary | ICD-10-CM

## 2021-01-12 MED ORDER — ALPRAZOLAM 0.25 MG PO TAB
ORAL_TABLET | Freq: Three times a day (TID) | 0 refills | PRN
Start: 2021-01-12 — End: ?

## 2021-01-12 NOTE — Telephone Encounter
Requested Prescriptions   Pending Prescriptions Disp Refills    ALPRAZolam (XANAX) 0.25 mg tablet [Pharmacy Med Name: ALPRAZOLAM 0.25 MG TABLET] 30 tablet      Sig: TAKE 1 TABLET BY MOUTH THREE TIMES A DAY AS NEEDED FOR ANXIETY       Psychiatry: Anxiolytics/Hypnotics Failed - 01/12/2021 10:34 AM        Failed - Valid encounter within last 6 months     Recent Visits  Date Type Provider Dept   07/06/20 Office Visit Baruch Gouty, MD [facility]   03/09/20 Office Visit Annia Belt, Becky Sax, MD [facility]   02/27/19 Office Visit Brubacher, Becky Sax, MD [facility]   Showing recent visits within past 720 days and meeting all other requirements  Future Appointments  No visits were found meeting these conditions.  Showing future appointments within next 360 days and meeting all other requirements            Failed - Patient is < 8 years old. Beers Criteria: Use with caution in patients 65+        Failed - This refill cannot be delegated

## 2021-01-13 ENCOUNTER — Encounter: Admit: 2021-01-13 | Discharge: 2021-01-13 | Payer: MEDICARE

## 2021-01-17 ENCOUNTER — Encounter: Admit: 2021-01-17 | Discharge: 2021-01-17 | Payer: MEDICARE

## 2021-01-17 ENCOUNTER — Ambulatory Visit: Admit: 2021-01-17 | Discharge: 2021-01-17 | Payer: MEDICARE

## 2021-01-17 DIAGNOSIS — M545 Chronic lower back pain: Secondary | ICD-10-CM

## 2021-01-17 DIAGNOSIS — M48062 Spinal stenosis, lumbar region with neurogenic claudication: Secondary | ICD-10-CM

## 2021-01-17 DIAGNOSIS — I10 Essential (primary) hypertension: Secondary | ICD-10-CM

## 2021-01-17 DIAGNOSIS — M47816 Spondylosis without myelopathy or radiculopathy, lumbar region: Secondary | ICD-10-CM

## 2021-01-17 DIAGNOSIS — M1710 Unilateral primary osteoarthritis, unspecified knee: Secondary | ICD-10-CM

## 2021-01-17 DIAGNOSIS — F419 Anxiety disorder, unspecified: Secondary | ICD-10-CM

## 2021-01-17 MED ORDER — IOHEXOL 240 MG IODINE/ML IV SOLN
2.5 mL | Freq: Once | EPIDURAL | 0 refills | Status: CP
Start: 2021-01-17 — End: ?
  Administered 2021-01-17: 19:00:00 1 mL via EPIDURAL

## 2021-01-17 MED ORDER — TRIAMCINOLONE ACETONIDE 40 MG/ML IJ SUSP
80 mg | Freq: Once | EPIDURAL | 0 refills | Status: CP
Start: 2021-01-17 — End: ?
  Administered 2021-01-17: 19:00:00 80 mg via EPIDURAL

## 2021-01-17 NOTE — Procedures
Attending Surgeon: Joanie Coddington, MD    Anesthesia: Local    Pre-Procedure Diagnosis:   1. Lumbar stenosis with neurogenic claudication        Post-Procedure Diagnosis:   1. Lumbar stenosis with neurogenic claudication        Pain Score: Four    [facility] INJECT INTERLAM LMBR/SAC  Procedure: epidural - interlaminar    Laterality: n/a    Location: lumbar ESI with imaging -  L5-S1      Consent:   Consent obtained: verbal and written  Consent given by: patient  Risks discussed: allergic reaction, bleeding, bruising, infection, nerve damage, no change or worsening in pain and reaction to medication    Discussed with patient the purpose of the treatment/procedure, other ways of treating my condition, including no treatment/ procedure and the risks and benefits of the alternatives. Patient has decided to proceed with treatment/procedure.        Universal Protocol:  Relevant documents: relevant documents present and verified  Test results: test results available and properly labeled  Imaging studies: imaging studies available  Required items: required blood products, implants, devices, and special equipment available  Site marked: the operative site was marked  Patient identity confirmed: Patient identify confirmed verbally with patient.        Time out: Immediately prior to procedure a time out was called to verify the correct patient, procedure, equipment, support staff and site/side marked as required      Procedures Details:   Indications: pain   Prep: chlorhexidine  Patient position: prone  Estimated Blood Loss: minimal  Specimens: none  Number of Joints: 1  Guidance: fluoroscopy  Contrast: Procedure confirmed with contrast under live fluoroscopy.  Needle and Epidural Catheter: tuohy  Injection procedure: Incremental injection and Negative aspiration for blood  Patient tolerance: Patient tolerated the procedure well with no immediate complications. Pressure was applied, and hemostasis was accomplished.  Comments: DESCRIPTION OF PROCEDURE:  The procedure risks and benefits were explained to the patient and informed consent was obtained.  The patient was positioned prone on the fluoroscopy table with a pillow under the abdomen to help reduce lumbar lordosis.  Blood pressure cuff and oxygen saturation monitors were attached and the patient was monitored throughout the entire procedure.  The L5 vertebral level was identified with the use of fluoroscopy in the AP view.  The skin was prepped using Chlorhexadine and draped in aseptic fashion.  The skin and subcutaneous tissue were anesthetized using 3 mL of 1 percent lidocaine with a 27-gauge needle.  A  22-gauge Tuohy needle was slowly advanced using AP view towards the interlaminar space.  The latter part of the needle advancement was guided with fluoroscopy in the lateral view.  The epidural space was identified using loss of resistance technique.  After negative aspiration, 1 mL of Isovue contrast dye was injected.  After epidural spread was seen, a solution containing 80 mg of triamcinolone and 2 mL of normal saline was injected in increments.  The stylet was reinserted and the needle was then removed.     After the procedure, the patient's blood pressure, heart rate, oxygen saturation, and VAS were recorded in the chart. There were no complications.  The patient tolerated the procedure well and was brought to recovery room for observation in stable condition and discharged with written discharge instructions.     PLAN OF CARE:  The patient is to followup in 3 weeks.    The patient was advised to contact  the [facility] for any of the following    Fever, chills, or night sweats.  New onset of severe sharp pain.  Any new upper or lower extremity weakness or numbness.  Any questions regarding the procedure.    This patient's clinical history, exam, AND imaging support radiculopathy AND there is a significant impact on quality of life and function AND their pain score has been documented in this note AND the pain has been present for at least 4 weeks AND they have failed to improve with noninvasive conservative care.             Administrations This Visit     iohexoL (OMNIPAQUE-240) 240 mg/mL injection 2.5 mL     Admin Date  01/17/2021 Action  Given Dose  1 mL Route  Epidural Administered By  Joanie Coddington, MD          triamcinolone acetonide [facility]) injection 80 mg     Admin Date  01/17/2021 Action  Given Dose  80 mg Route  Epidural Administered By  Joanie Coddington, MD              Estimated blood loss: none or minimal  Specimens: none  Patient tolerated the procedure well with no immediate complications. Pressure was applied, and hemostasis was accomplished.

## 2021-01-17 NOTE — Progress Notes
SPINE CENTER  INTERVENTIONAL PAIN PROCEDURE HISTORY AND PHYSICAL    Chief Complaint: Pain    HISTORY OF PRESENT ILLNESS:  Kimberly Phelps is a 70 y.o. year old female who presents for injection.  Denies fevers, chills, or recent hospitalizations.          Medical History:   Diagnosis Date   ? Anxiety    ? Chronic lower back pain    ? HTN (hypertension)    ? Lumbar facet arthropathy    ? Patellofemoral arthritis        Surgical History:   Procedure Laterality Date   ? BARTHOLIN GLAND CYST EXCISION  1978   ? ELBOW SURGERY Right 1989    scar tissue removal for tendonitis   ? HYDROGEN BREATH TEST N/A 12/12/2016    Performed by Jolee Ewing, MD at [facility]   ? COLONOSCOPY N/A 12/27/2016    Performed by Jolee Ewing, MD at [facility] OR   ? ESOPHAGOGASTRODUODENOSCOPY w/ bxs N/A 12/27/2016    Performed by Jolee Ewing, MD at [facility] OR       family history includes Basal Cell Carcinoma in her father; Cancer in her father; Essie Christine (age of onset: 84) in her mother; Depression in her sister; Hypertension in her father and mother; Migraines in her paternal grandmother and sister; Other in her father and mother; Seizures in her paternal uncle; Thyroid Disease in her sister.    Social History     Socioeconomic History   ? Marital status: Married   ? Number of children: 0   Occupational History   ? Occupation: Chiropractor: [employer]   Tobacco Use   ? Smoking status: Former Smoker     Packs/day: 1.00     Years: 20.00     Pack years: 20.00     Types: Cigarettes     Quit date: 11/03/1995     Years since quitting: 25.2   ? Smokeless tobacco: Never Used   ? Tobacco comment: none x 12 years   Substance and Sexual Activity   ? Alcohol use: Yes     Alcohol/week: 14.0 standard drinks     Types: 8 Glasses of wine, 6 Standard drinks or equivalent per week   ? Drug use: No   Other Topics Concern   ? Caffeine Concern Yes     Comment: 1-2 cups per day   ? Exercise Yes     Comment: 30- 40 minutes per day Allergies   Allergen Reactions   ? Erythromycin NAUSEA AND VOMITING       Vitals:    01/17/21 1332   BP: 139/79   BP Source: Arm, Right Upper   Pulse: 79   Temp: 37.2 ?C (99 ?F)   Resp: 12   SpO2: 100%   TempSrc: Oral   PainSc: Four   Weight: 69.4 kg (153 lb)   Height: 160 cm (5' 3)     Pain Score: Four       REVIEW OF SYSTEMS: 10 point ROS obtained and negative except for back pain      PHYSICAL EXAM:  General: 70 y.o. female appears stated age, in no acute distress  HEENT: Normocephalic, atraumatic  Neck: No thyroidmegaly  Cardiovascular: Well perfused  Pulmonary: Unlabored respirations  Extremities: No cyanosis, clubbing, or edema  Skin: No lesions seen on exposed skin  Psychiatric:  Appropriate mood and affect  Musculoskeletal: No atrophy.   Neurologic: Antigravity strength in all extremities. CN II -  XII grossly intact.  Alert and oriented x 3.         IMPRESSION:    1. Lumbar stenosis with neurogenic claudication         PLAN:   L5-S1 ILESI

## 2021-01-17 NOTE — Patient Instructions
Procedure Completed Today: Lumbar Epidural Steroid Injection    Important information following your procedure today: You may drive today    Pain relief may not be immediate. It is possible you may even experience an increase in pain during the first 24-48 hours followed by a gradual decrease of your pain.  Though the procedure is generally safe and complications are rare, we Remedy Corporan ask that you be aware of any of the following:   Any swelling, persistent redness, new bleeding, or drainage from the site of the injection.  You should not experience a severe headache.  You should not run a fever over 101? F.  New onset of sharp, severe back & or neck pain.  New onset of upper or lower extremity numbness or weakness.  New difficulty controlling bowel or bladder function after the injection.  New shortness of breath.    If any of these occur, please call to report this occurrence to a nurse at [phone]. If you are calling after 4:00 p.m., on weekends or holidays please call [phone] and ask to have the resident physician on call for the physician paged or go to your local emergency room.  You may experience soreness at the injection site. Ice can be applied at 20 minute intervals. Avoid application of direct heat, hot showers or hot tubs today.  Avoid strenuous activity today. You may resume your regular activities and exercise tomorrow.  Patients with diabetes may see an elevation in blood sugars for 7-10 days after the injection. It is important to pay close attention to your diet, check your blood sugars daily and report extreme elevations to the physician that treats your diabetes.  Patients taking a daily blood thinner can resume their regular dose this evening.  It is important that you take all medications ordered by your pain physician. Taking medication as ordered is an important part of your pain care plan. If you cannot continue the medication plan, please notify the physician.     Possible side effects to steroids that may occur:  Flushing or redness of the face  Irritability  Fluid retention  Change in women?s menses    The following medications were used: Lidocaine , Triamcinolone  , and Contrast Dye

## 2021-02-01 ENCOUNTER — Encounter: Admit: 2021-02-01 | Discharge: 2021-02-01 | Payer: MEDICARE

## 2021-02-01 DIAGNOSIS — I10 Essential (primary) hypertension: Secondary | ICD-10-CM

## 2021-02-01 MED ORDER — AMLODIPINE 5 MG PO TAB
ORAL_TABLET | Freq: Every day | 1 refills | Status: AC
Start: 2021-02-01 — End: ?

## 2021-02-01 NOTE — Telephone Encounter
Some refill protocol elements NOT Met  Medication name: Amlodipine  Medication Strength: 90m tablet      Office visit due   Valid encounter within last 6 months  LOV: 07/06/20.    Routed to Provider     LTruitt Leep RN

## 2021-02-14 ENCOUNTER — Encounter: Admit: 2021-02-14 | Discharge: 2021-02-14 | Payer: MEDICARE

## 2021-02-14 NOTE — Telephone Encounter
Patient called and stated that she has moved to [place] and did get shingles after the move and was inquiring about her last shingles shot and if it was the newer version or the older version. Called patient and discussed shingles information that is available in her record as shingles shot was not provided through our offfice as it was completed in 2014 and patient established care with Dr. Annia Belt in 2018. Removed Dr. Annia Belt from PCP field as patient stated that she is no longer a residence in [place] and will seek medical care in her current state of residence. Emilia Beck, RN

## 2021-04-01 ENCOUNTER — Encounter: Admit: 2021-04-01 | Discharge: 2021-04-01 | Payer: MEDICARE

## 2021-04-01 DIAGNOSIS — I10 Essential (primary) hypertension: Secondary | ICD-10-CM

## 2021-04-01 MED ORDER — LISINOPRIL 20 MG PO TAB
ORAL_TABLET | Freq: Every day | 1 refills
Start: 2021-04-01 — End: ?

## 2021-04-01 NOTE — Telephone Encounter
Some refill protocol elements NOT Met  Medication name: Lisinopril  Medication Strength: 5m tablet      Office visit due   Valid encounter within last 6 months    [place]  Will deny refill request.     LTruitt Leep RN

## 2021-04-05 ENCOUNTER — Encounter: Admit: 2021-04-05 | Discharge: 2021-04-05 | Payer: MEDICARE

## 2021-04-05 DIAGNOSIS — I10 Essential (primary) hypertension: Secondary | ICD-10-CM

## 2021-04-05 MED ORDER — LISINOPRIL 20 MG PO TAB
20 mg | ORAL_TABLET | Freq: Every day | ORAL | 0 refills | Status: AC
Start: 2021-04-05 — End: ?

## 2021-04-05 MED ORDER — DULOXETINE 60 MG PO CPDR
60 mg | ORAL_CAPSULE | Freq: Every day | ORAL | 0 refills | 60.00000 days | Status: AC
Start: 2021-04-05 — End: ?

## 2021-04-05 NOTE — Telephone Encounter
Patient has moved to [place]. Last seen in this clinic on 07/06/2020. Refills can be sent to last until 07/06/2021 as patient stated that she has not yet established with a PCP in [place].    Refills were sent on amlodipine #90 tablets x 1 refill on 02/01/2021.     Patient will be due for duloxetine and lisinopril. Duloxetine last filled #90 tablets x 1 refill on 11/14/2020. Lisinopril last filled #90 tablets x 1 refill on 09/30/2020.     Sending 90 day supply on duloxetine and lisinopril to the patient's previous pharmacy in [place] and she will get these transferred to her new [facility] pharmacy in [place].

## 2021-04-07 ENCOUNTER — Encounter: Admit: 2021-04-07 | Discharge: 2021-04-07 | Payer: MEDICARE

## 2021-04-07 MED ORDER — DULOXETINE 60 MG PO CPDR
ORAL_CAPSULE | Freq: Every day | 0 refills
Start: 2021-04-07 — End: ?

## 2021-04-07 NOTE — Telephone Encounter
Some refill protocol elements NOT Met  Medication name: Cymbalta  Medication Strength: 71m capsule      Office visit due   Valid encounter within last 6 months    Last prescribed on 04/05/21 for a 3 month supply.    Refill request too soon.  Will deny at this time.     LTruitt Leep RN

## 2021-06-21 ENCOUNTER — Encounter: Admit: 2021-06-21 | Discharge: 2021-06-21 | Payer: MEDICARE

## 2021-06-21 MED ORDER — DULOXETINE 60 MG PO CPDR
60 mg | ORAL_CAPSULE | Freq: Every day | ORAL | 0 refills | 60.00000 days | Status: AC
Start: 2021-06-21 — End: ?

## 2021-06-21 NOTE — Telephone Encounter
Patient has moved to [place]. She will establish care with her new PCP in 2 weeks.Sent in two week supply of duloxetine to patient's local pharmacy. Emilia Beck, RN

## 2021-06-30 ENCOUNTER — Encounter: Admit: 2021-06-30 | Discharge: 2021-06-30 | Payer: MEDICARE

## 2021-06-30 MED ORDER — DULOXETINE 60 MG PO CPDR
ORAL_CAPSULE | 0 refills
Start: 2021-06-30 — End: ?

## 2021-06-30 NOTE — Telephone Encounter
Some refill protocol elements NOT Met  Medication name: Cymbalta  Medication Strength: 35m capsule      Office visit due   Valid encounter within last 6 months  LOV: 07/06/20.  No follow up.  [place]    Will deny refill request.     LTruitt Leep RN

## 2021-07-02 ENCOUNTER — Encounter: Admit: 2021-07-02 | Discharge: 2021-07-02 | Payer: MEDICARE

## 2021-07-02 DIAGNOSIS — I10 Essential (primary) hypertension: Secondary | ICD-10-CM

## 2021-07-02 MED ORDER — LISINOPRIL 20 MG PO TAB
ORAL_TABLET | 0 refills
Start: 2021-07-02 — End: ?

## 2021-07-03 ENCOUNTER — Encounter: Admit: 2021-07-03 | Discharge: 2021-07-03 | Payer: MEDICARE

## 2023-01-04 IMAGING — DX LUMBAR SPINE AP, LAT WITH FLEXION AND EXTEN
1 series · 4 of 4 positions shown · non-contrast
Comparison: None

________________________________________________________________________________________________ 
LUMBAR SPINE AP, LAT WITH FLEXION AND EXTEN, 01/04/2023 [DATE]: 
CLINICAL INDICATION: Radiculopathy, Lumbar Region

[Series 1: AP · 0.14mm/px · 4 of 4 slices shown]
[im 1/4]
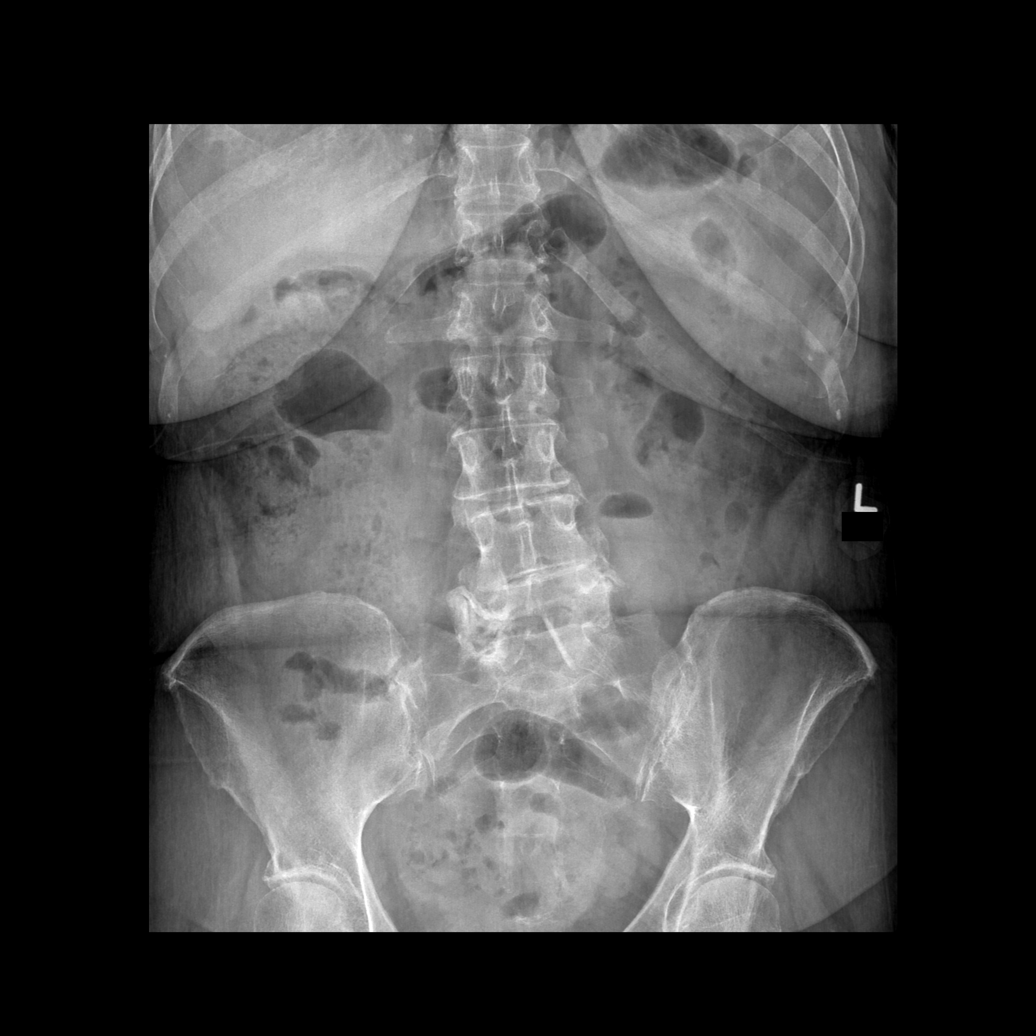
[im 2/4]
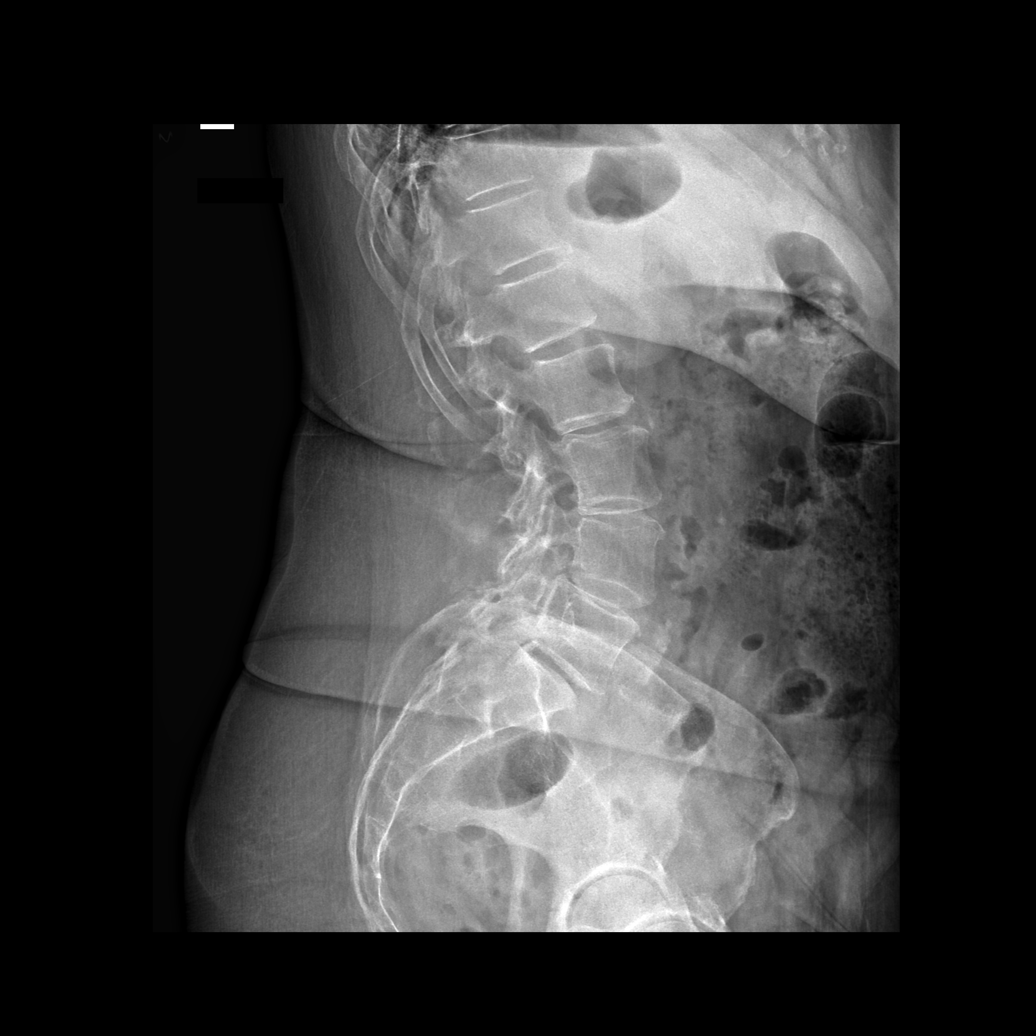
[im 3/4]
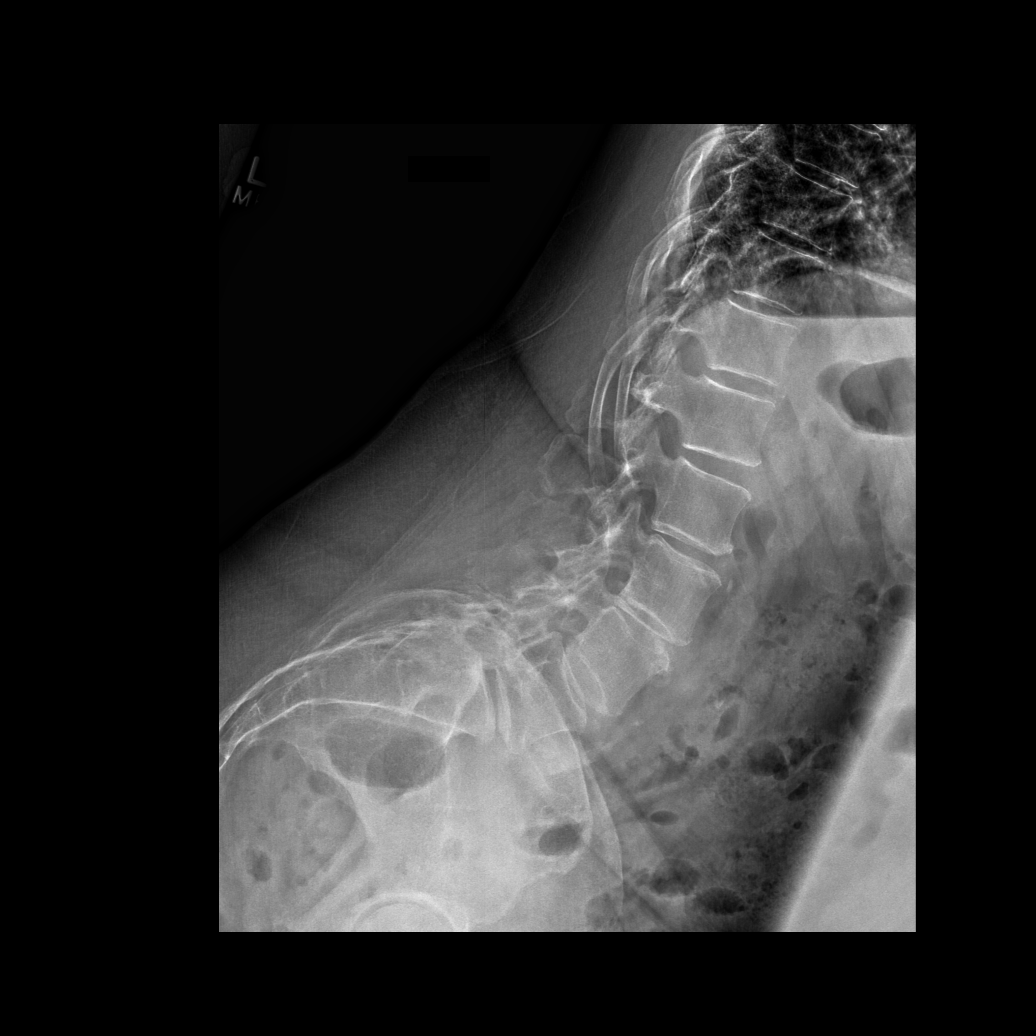
[im 4/4]
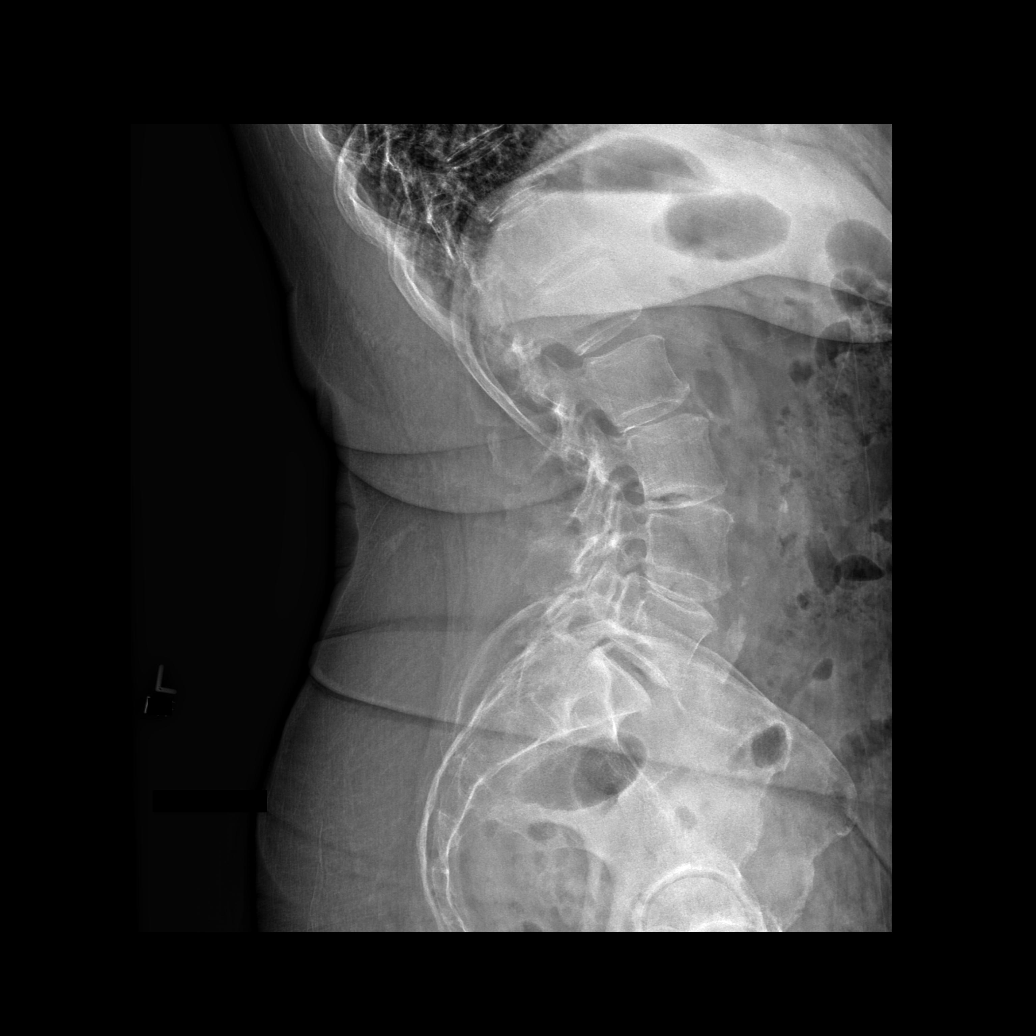

[4 of 4 positions shown; findings below may reference images not displayed]

FINDINGS: Mild lumbar curvature. Grade 1 anterolisthesis L4 on L5 without change 
with flexion or extension positioning. Scattered disc height loss and 
osteophytic spurring. There is lower lumbar facet hypertrophy. Mild degenerative 
changes SI joints and both hips. Soft tissues are negative.
IMPRESSION: Lumbar curvature and moderately advanced degenerative changes. Consideration 
could be made for MR exam.

## 2023-01-09 IMAGING — MR MRI LUMBAR SPINE WITHOUT CONTRAST
6 of 8 series · 12 of 48 positions shown · IV contrast (gadolinium)
Comparison: Lumbar spine x-ray January 04, 2023.

________________________________________________________________________________________________ 
MRI LUMBAR SPINE WITHOUT CONTRAST, 01/09/2023 [DATE]: 
CLINICAL INDICATION: Radiculopathy, Lumbar Region , evaluate for spinal 
stenosis.
TECHNIQUE: Multiplanar, multiecho position MR images of the lumbar spine were 
performed without intravenous gadolinium enhancement. Patient was scanned on a 
1.5T magnet

[Series 101: survey · axial · 10.0mm · 1.25mm/px · 1 of 10 slices shown]
[im 1/10]
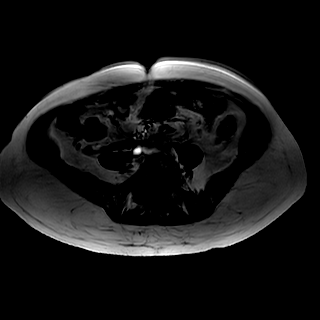

[Series 201: t2w_cor-surv · coronal · 6.0mm · 0.62mm/px · 1 of 10 slices shown]
[im 1/10]
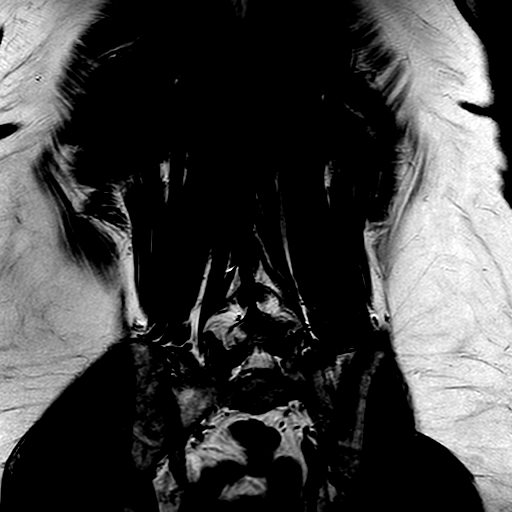

[Series 301: t1_tse_sag · sagittal · 4.0mm · 0.31mm/px · 3 of 19 slices shown]
[im 1/19]
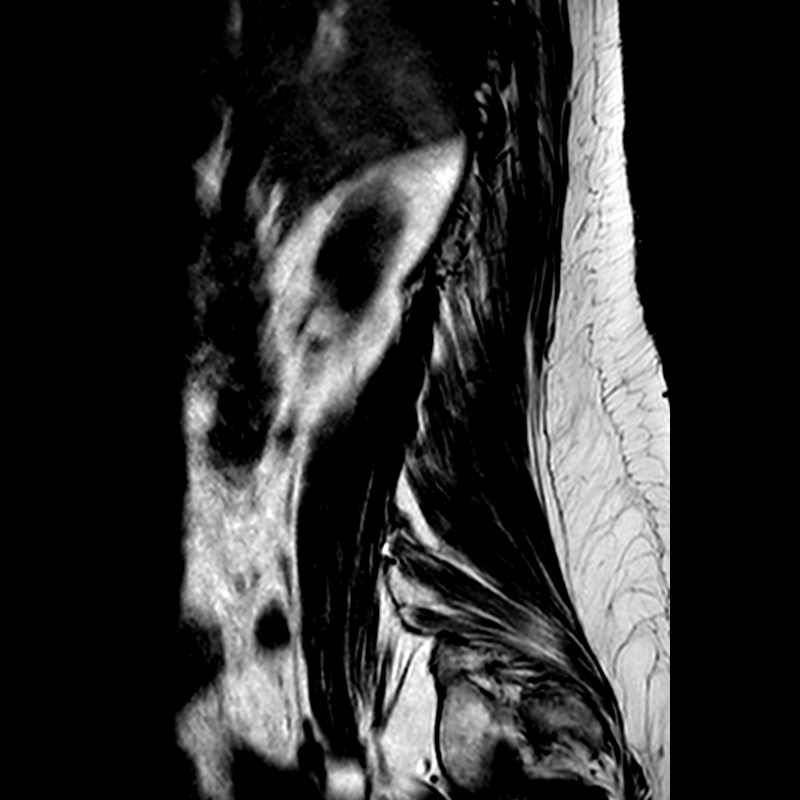
[im 10/19]
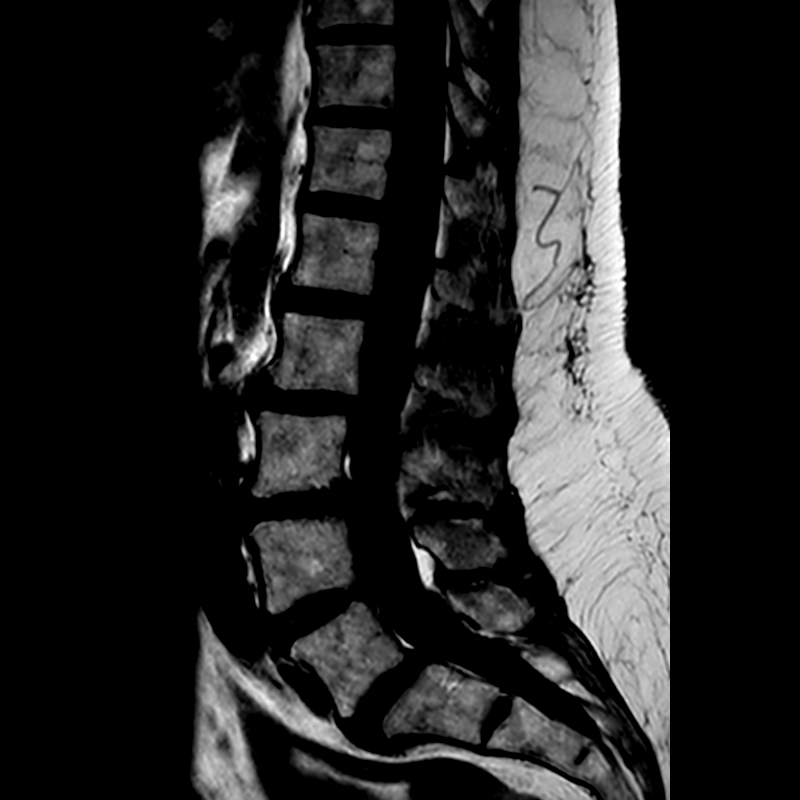
[im 19/19]
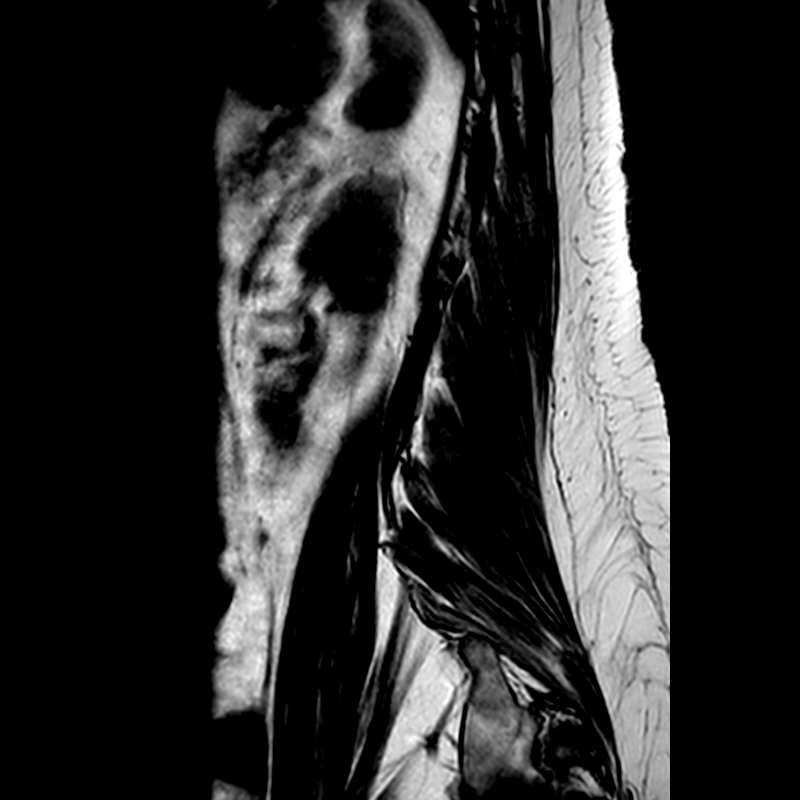

[Series 402: (id)_mdixon_tse · sagittal · 4.0mm · 0.47mm/px · 3 of 19 slices shown]
[im 1/19]
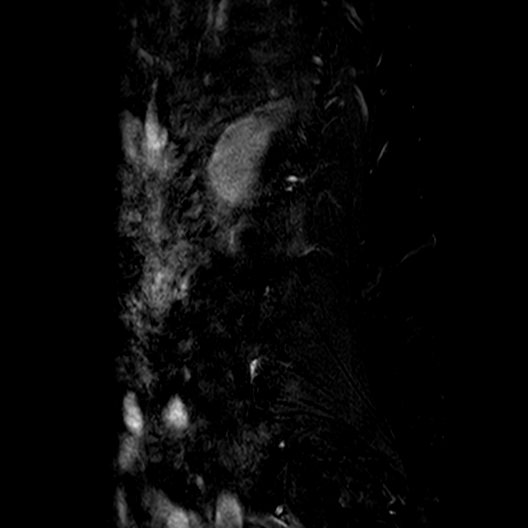
[im 10/19]
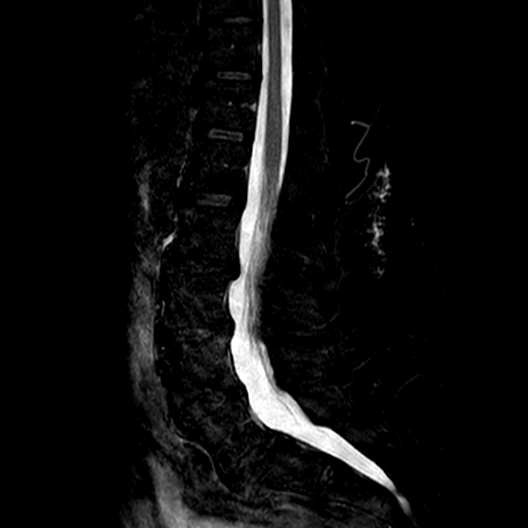
[im 19/19]
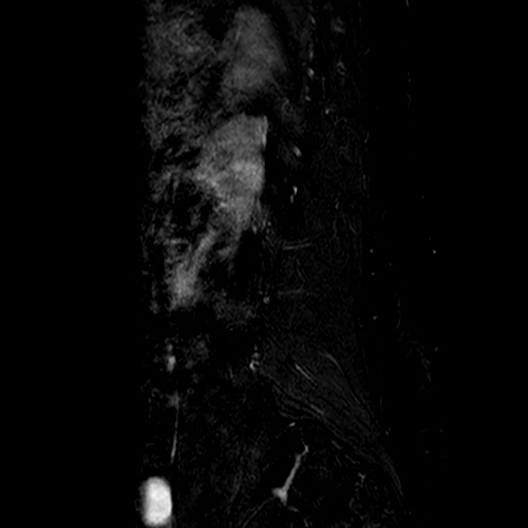

[Series 403: st2w_mdixon_tse · sagittal · 4.0mm · 0.47mm/px · 3 of 19 slices shown]
[im 1/19]
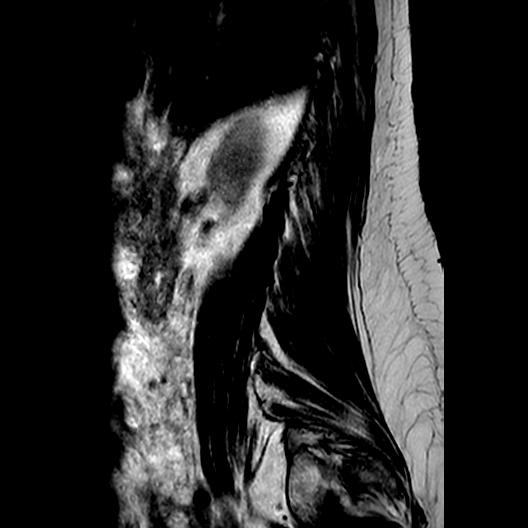
[im 10/19]
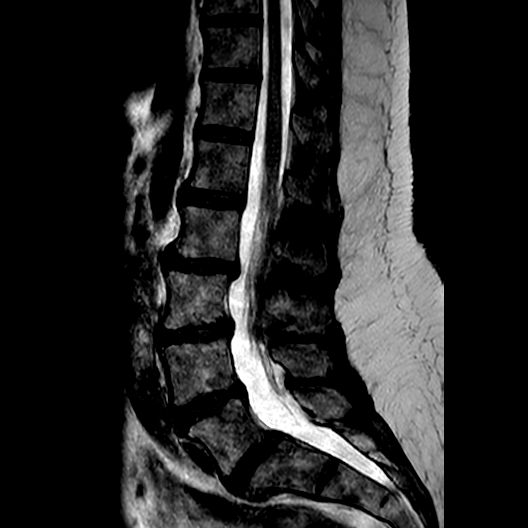
[im 19/19]
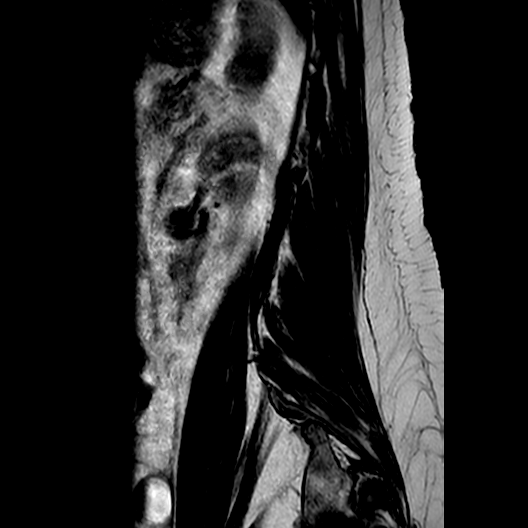

[Series 502: (id) view_ax mpr · axial · 1.0mm · 0.25mm/px · 1 of 134 slices shown]
[im 8/134]
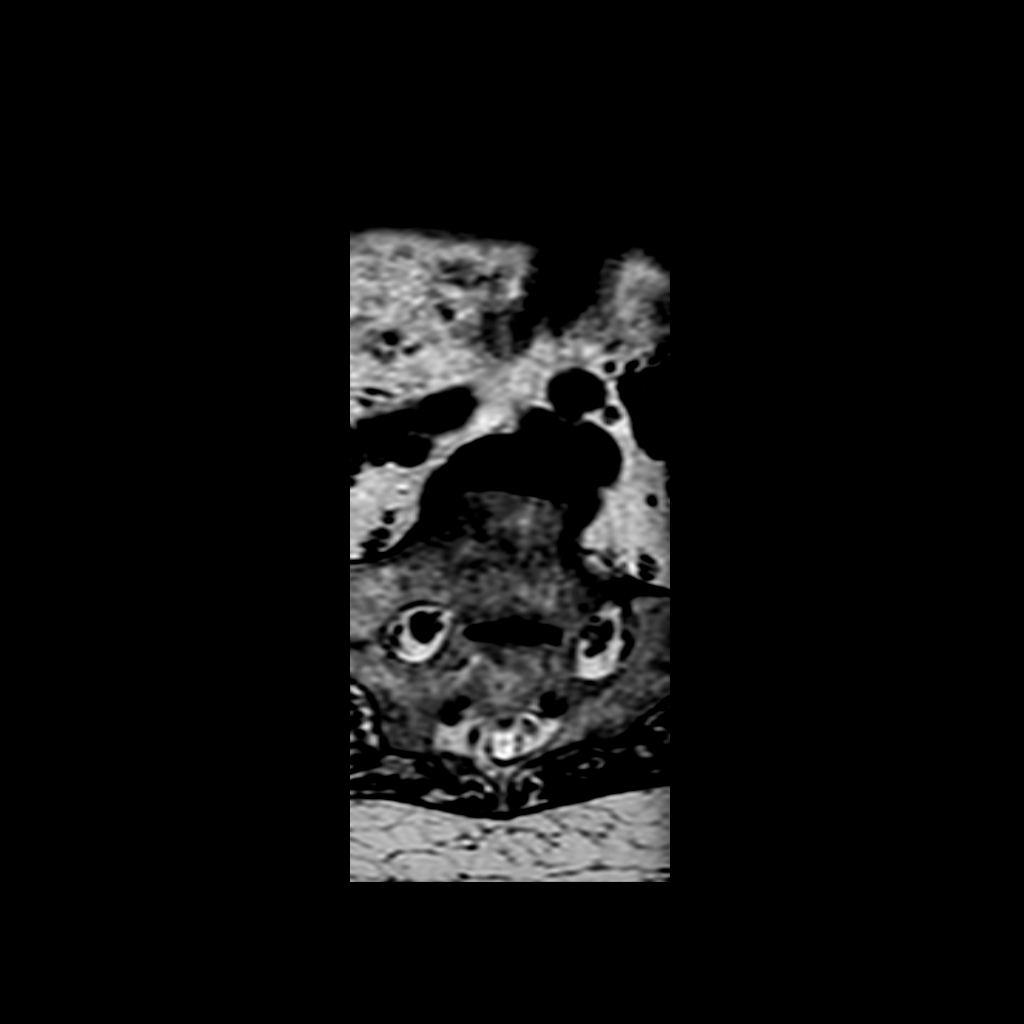

[12 of 48 positions shown; findings below may reference images not displayed]

FINDINGS: -------------------------------------------------------------------------------- 
------ 
GENERAL: 
Nomenclature is based on 5 lumbar type vertebral bodies.     
ALIGNMENT: Mild levoconvex lower lumbar scoliosis, similar to prior radiographs. 
Grade 1 retrolisthesis L2 on L3 with grade 1 anterolisthesis L5 on S1. It would 
be difficult to exclude a nondisplaced left L5 pars defect. 
VERTEBRAL BODY HEIGHT: Normal.  
MARROW SIGNAL: No focal suspect signal abnormality. 
CORD SIGNAL: Normal distal spinal cord and cauda equina. Conus medullaris 
terminates at L1-L2. 
ADDITIONAL FINDINGS: There is a potential 2.1 cm cystic structure in the left 
pelvis, incompletely visualized.. Colonic diverticulosis. 
Modic I-II: None. 
Ligamentum Flavum > 2.5 mm: All levels. 
-------------------------------------------------------------------------------- 
------ 
SEGMENTAL: 
T12-L1: Loss of disc signal. Otherwise normal. Right nerve root sleeve cyst 
incidentally noted. 
L1-L2: Loss of disc signal with minimal annular bulge. Otherwise normal. 
L2-L3: Mild loss of disc height and signal. Diffuse annular bulge is somewhat 
more prominent to the right. Mild canal stenosis. Lateral recess narrowing 
bilaterally, worse on the right. Mild bilateral foraminal narrowing. Bilateral 
facet joint effusions. 
L3-L4: Mild loss of disc height posterior early. Loss of disc signal. Minimal 
annular bulge. Canal and right foramen patent. Mild left foraminal narrowing. 
Schmorls nodes. 
L4-L5: Mild loss of disc height and signal. Mild annular bulge. Canal patent. 
Subtle right paracentral annular fissure. Mild facet arthropathy. Moderate 
bilateral foraminal narrowing. 
L5-S1: Loss of disc signal. Canal patent. Left foramen patent. Mild/moderate 
right foraminal narrowing. Facet arthropathy. 
-------------------------------------------------------------------------------- 
------
IMPRESSION: Degenerative and scoliotic changes. 
Potential cystic structure in the left pelvis, incompletely visualized. 
Follow-up pelvic ultrasound recommended. 
No critical or significant canal narrowing with variable lateral recess and 
foraminal narrowing detailed above.

## 2023-02-08 IMAGING — CT CTA CORONARY
2 of 4 series · 14 of 20 positions shown, 16 images · IV contrast (ISOVUE 300)
Comparison: None.

________________________________________________________________________________________________ 
CTA CORONARY, 02/08/2023 [DATE]: 
CLINICAL INDICATION:  Atherosclerotic Heart Disease Of Native Coronary Artery 
With 
A search for DICOM formatted images was conducted for prior CT imaging studies 
completed at a non-affiliated media free facility.
TECHNIQUE: The region of interest was scanned with contrast on a high resolution 
CT scanner using dose reduction techniques. 3D renderings were reconstructed on 
an independent rotation. 60 mL of Isovue 370 MDV were administered per protocol. 
The patients eGFR was calculated to be 70.9 mL/min/1.73 m2 using the i-STAT 
device. 
Images were performed and reconstructed using ECG gating. To reduce the risk of 
nephrotoxicity and to adequately monitor the patient, the patient was monitored 
for over 30 minutes post procedure with 250 mL of normal saline administered 
intravenously. 
CORONARY ARTERY EVALUATION: 
Dominance: The patient is right dominant. 
Left Main: No plaque or stenosis. 
Left Anterior Descending: No plaque or stenosis. 
Left Circumflex: No plaque or stenosis. 
Right Coronary: No plaque or stenosis. 
CARDIAC ANATOMY: No chamber enlargement or hypertrophy.  No evidence of 
hypoperfusion or infarct. 
AORTA/GREAT VESSELS: No aneurysm or dissection. 
LUNGS/ PLEURA: Clear.  No effusion. 
MEDIASTINUM: No mass. Small hiatal hernia. 
OSSEOUS STRUCTURES: No acute fracture or destructive lesion.

[Series 7: multi 30-75% soft · axial · 0.43mm/px · z∈[-195,-106]mm · 7 of 1360 slices shown, 9 images]
[im 170/1360  vessel]
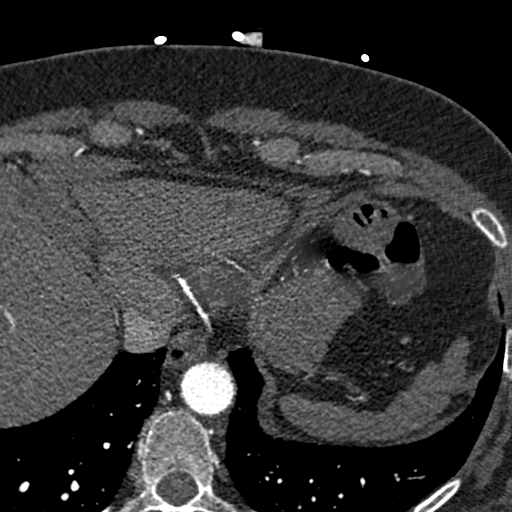
[im 170/1360  lung]
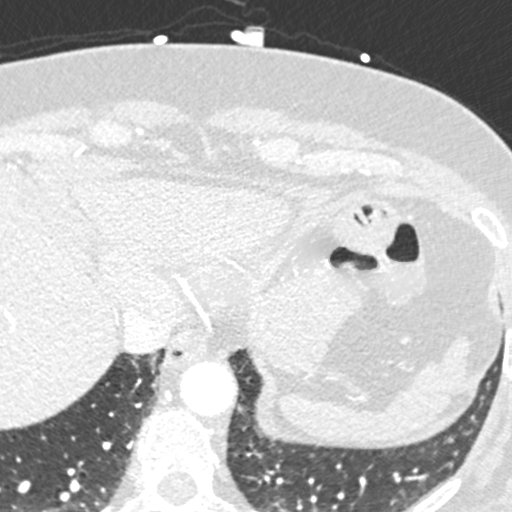
[im 340/1360  vessel]
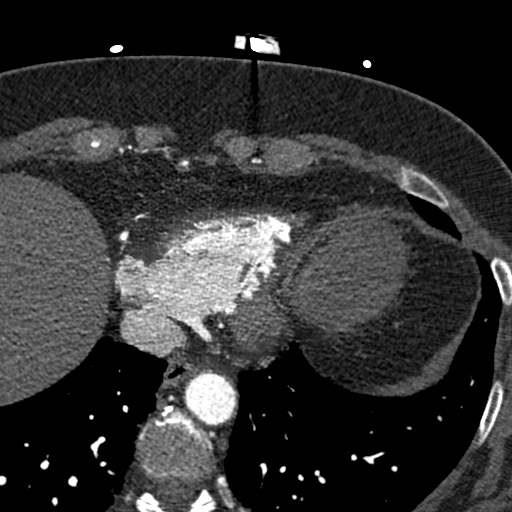
[im 510/1360  vessel]
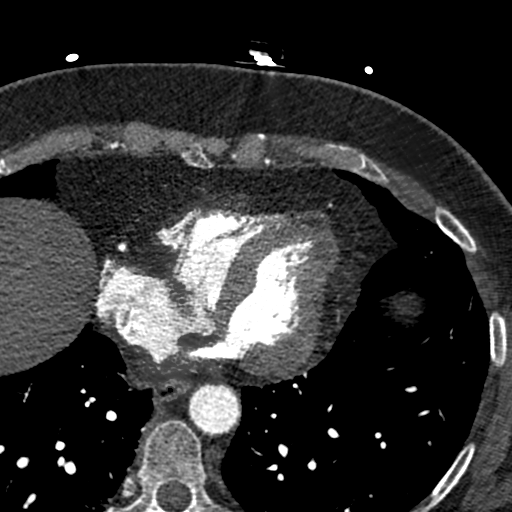
[im 680/1360  vessel]
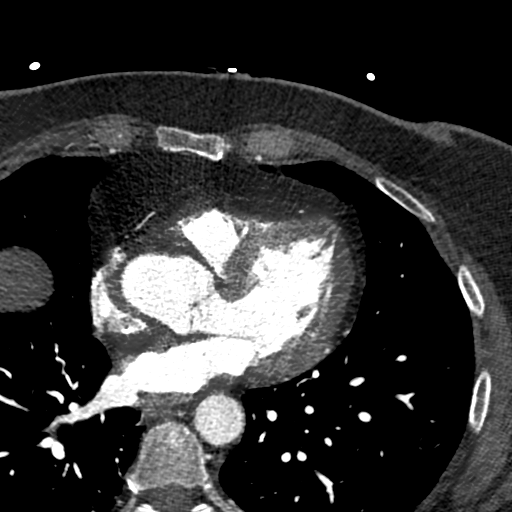
[im 850/1360  vessel]
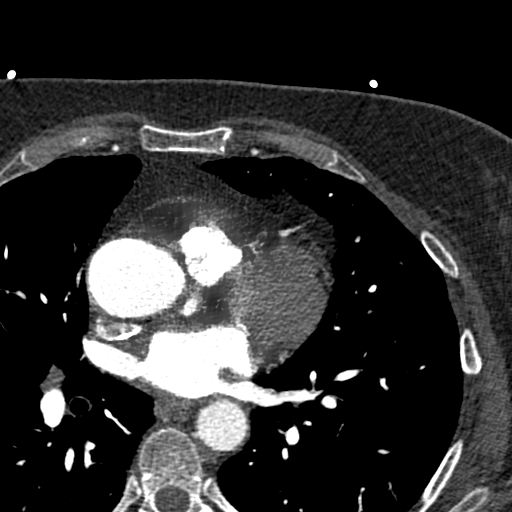
[im 850/1360  lung]
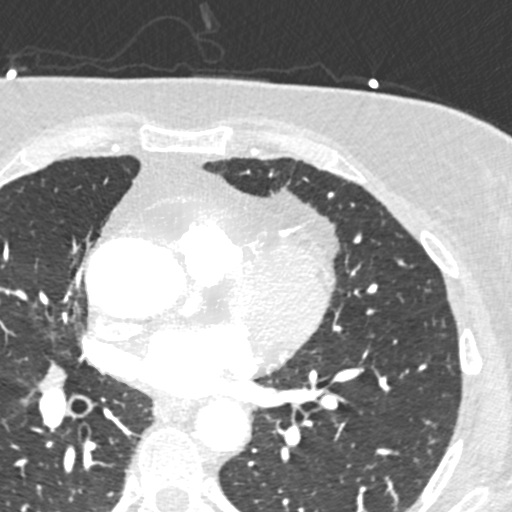
[im 1020/1360  vessel]
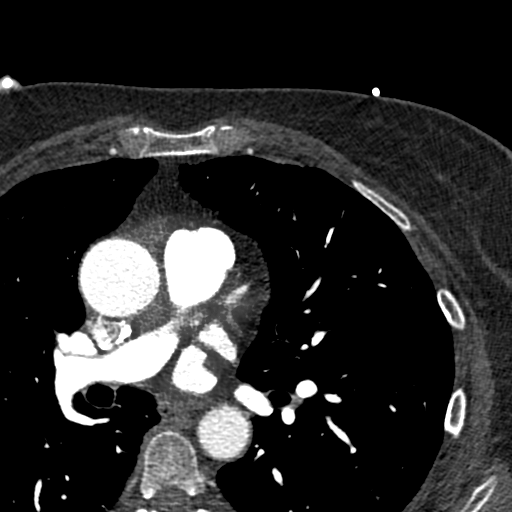
[im 1190/1360  vessel]
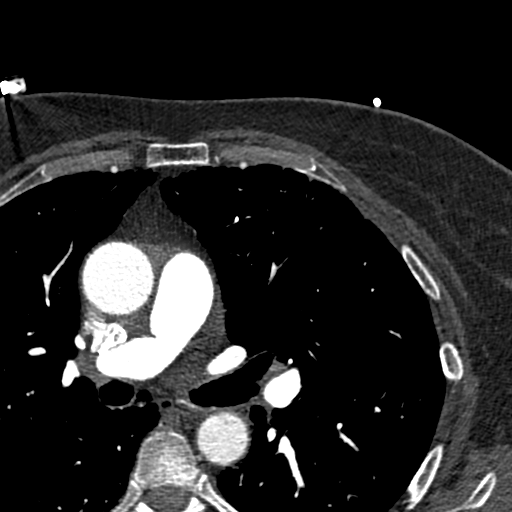

[Series 9: multi 30-80% sharp · axial · 0.69mm/px · z∈[-195,-106]mm · 7 of 1360 slices shown]
[im 170/1360  lung]
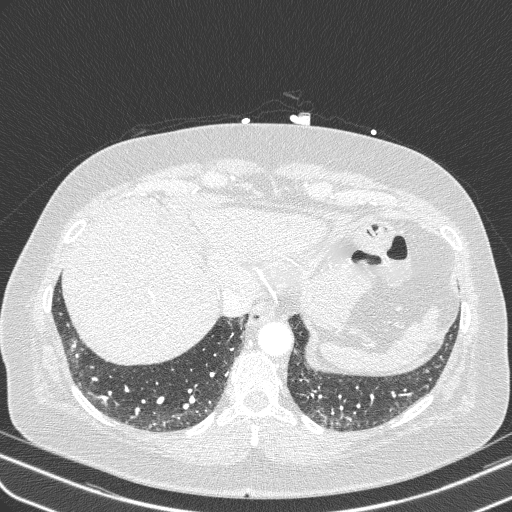
[im 340/1360  lung]
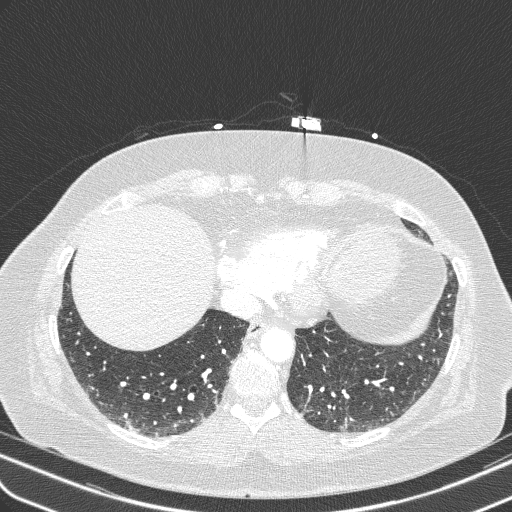
[im 510/1360  lung]
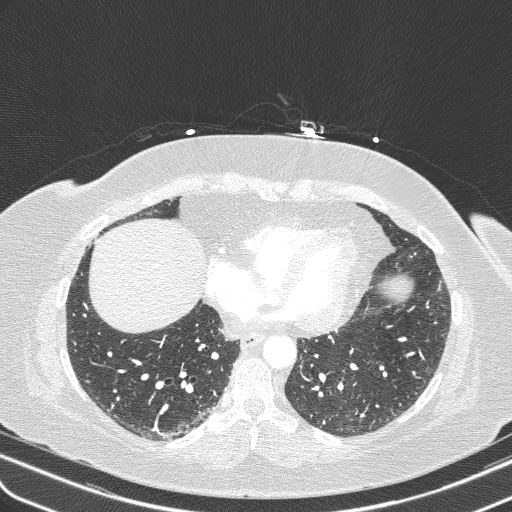
[im 680/1360  lung]
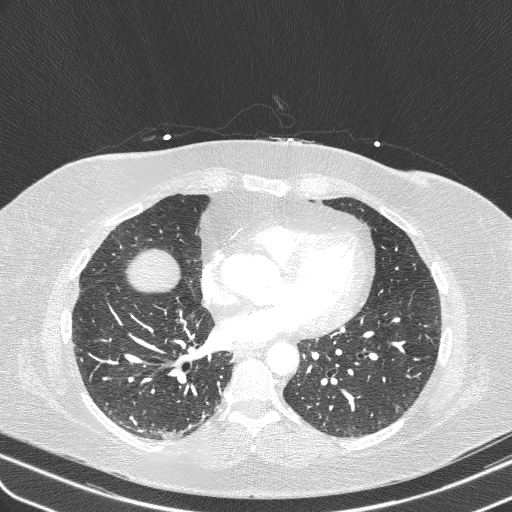
[im 850/1360  lung]
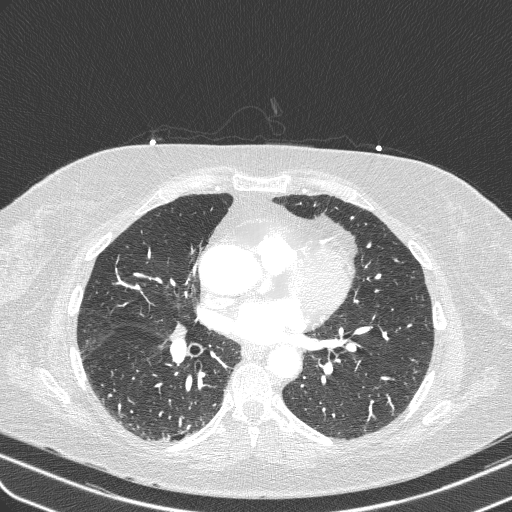
[im 1020/1360  lung]
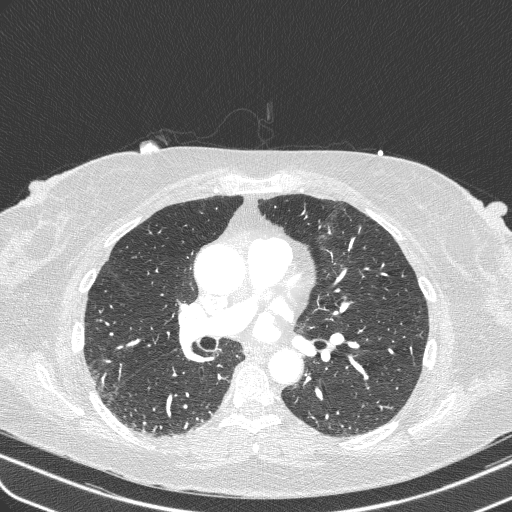
[im 1190/1360  lung]
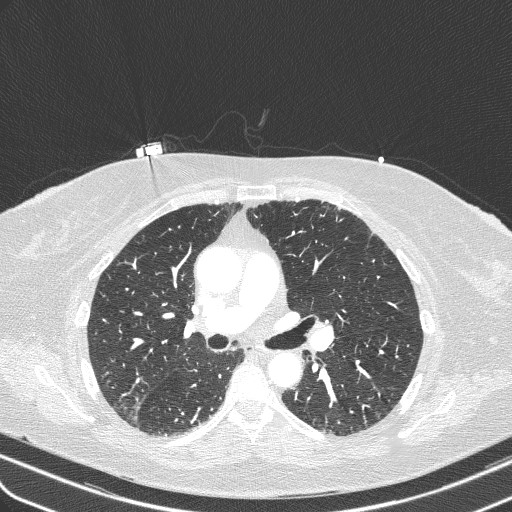

[14 of 20 positions shown; findings below may reference images not displayed]

Count of known CT and Cardiac Nuclear Medicine studies performed in the previous 
12 months = 0,
IMPRESSION: 1. Normal myocardial morphology. 
2. No significant coronary artery stenosis. 
3. No significant extra-cardiac findings. 
CAD RADS: 
[id number]   1-24%   Minimal non-obstructive 
RADIATION DOSE REDUCTION: All CT scans are performed using radiation dose 
reduction techniques, when applicable.  Technical factors are evaluated and 
adjusted to ensure appropriate moderation of exposure.  Automated dose 
management technology is applied to adjust the radiation doses to minimize 
exposure while achieving diagnostic quality images.

## 2023-06-20 ENCOUNTER — Encounter: Admit: 2023-06-20 | Discharge: 2023-06-20 | Payer: MEDICARE
